# Patient Record
Sex: Female | Born: 1985 | Race: White | Hispanic: No | Marital: Married | Smoking: Former smoker
Health system: Southern US, Community
[De-identification: ages and names within clinical notes are randomized; demographics above are authoritative.]

## PROBLEM LIST (undated history)

## (undated) ENCOUNTER — Inpatient Hospital Stay: Payer: Self-pay

## (undated) DIAGNOSIS — J40 Bronchitis, not specified as acute or chronic: Secondary | ICD-10-CM

## (undated) DIAGNOSIS — O24419 Gestational diabetes mellitus in pregnancy, unspecified control: Secondary | ICD-10-CM

## (undated) DIAGNOSIS — R748 Abnormal levels of other serum enzymes: Secondary | ICD-10-CM

## (undated) DIAGNOSIS — M797 Fibromyalgia: Secondary | ICD-10-CM

## (undated) DIAGNOSIS — K589 Irritable bowel syndrome without diarrhea: Secondary | ICD-10-CM

## (undated) DIAGNOSIS — F329 Major depressive disorder, single episode, unspecified: Secondary | ICD-10-CM

## (undated) DIAGNOSIS — E282 Polycystic ovarian syndrome: Secondary | ICD-10-CM

## (undated) DIAGNOSIS — N809 Endometriosis, unspecified: Secondary | ICD-10-CM

## (undated) DIAGNOSIS — F4310 Post-traumatic stress disorder, unspecified: Secondary | ICD-10-CM

## (undated) DIAGNOSIS — R011 Cardiac murmur, unspecified: Secondary | ICD-10-CM

## (undated) DIAGNOSIS — F32A Depression, unspecified: Secondary | ICD-10-CM

## (undated) HISTORY — PX: HERNIA REPAIR: SHX51

## (undated) HISTORY — DX: Major depressive disorder, single episode, unspecified: F32.9

## (undated) HISTORY — DX: Irritable bowel syndrome without diarrhea: K58.9

## (undated) HISTORY — PX: WISDOM TOOTH EXTRACTION: SHX21

## (undated) HISTORY — PX: DILATION AND CURETTAGE OF UTERUS: SHX78

## (undated) HISTORY — PX: TONSILLECTOMY: SUR1361

## (undated) HISTORY — DX: Depression, unspecified: F32.A

## (undated) HISTORY — DX: Gestational diabetes mellitus in pregnancy, unspecified control: O24.419

---

## 2016-02-18 ENCOUNTER — Emergency Department: Payer: BLUE CROSS/BLUE SHIELD

## 2016-02-18 ENCOUNTER — Emergency Department
Admission: EM | Admit: 2016-02-18 | Discharge: 2016-02-18 | Disposition: A | Discharge status: Home / Self Care | Payer: BLUE CROSS/BLUE SHIELD | Attending: Emergency Medicine | Admitting: Emergency Medicine

## 2016-02-18 ENCOUNTER — Encounter: Payer: Self-pay | Admitting: Emergency Medicine

## 2016-02-18 DIAGNOSIS — R05 Cough: Secondary | ICD-10-CM | POA: Diagnosis present

## 2016-02-18 DIAGNOSIS — J40 Bronchitis, not specified as acute or chronic: Secondary | ICD-10-CM | POA: Insufficient documentation

## 2016-02-18 DIAGNOSIS — Z79899 Other long term (current) drug therapy: Secondary | ICD-10-CM | POA: Insufficient documentation

## 2016-02-18 HISTORY — DX: Fibromyalgia: M79.7

## 2016-02-18 HISTORY — DX: Endometriosis, unspecified: N80.9

## 2016-02-18 HISTORY — DX: Post-traumatic stress disorder, unspecified: F43.10

## 2016-02-18 HISTORY — DX: Polycystic ovarian syndrome: E28.2

## 2016-02-18 MED ORDER — BENZONATATE 100 MG PO CAPS: ORAL_CAPSULE | ORAL | 0 refills | Status: DC

## 2016-02-18 MED ORDER — AZITHROMYCIN 250 MG PO TABS: ORAL_TABLET | ORAL | 0 refills | Status: AC

## 2016-02-18 MED ORDER — ACETAMINOPHEN-CODEINE #3 300-30 MG PO TABS: 1.0000 | ORAL_TABLET | Freq: Three times a day (TID) | ORAL | 0 refills | Status: DC | PRN

## 2016-02-18 MED ORDER — IPRATROPIUM-ALBUTEROL 0.5-2.5 (3) MG/3ML IN SOLN
3.0000 mL | Freq: Once | RESPIRATORY_TRACT | Status: AC
Start: 2016-02-18 — End: 2016-02-18
  Administered 2016-02-18: 3 mL via RESPIRATORY_TRACT
  Filled 2016-02-18: qty 3

## 2016-02-18 MED ORDER — ALBUTEROL SULFATE HFA 108 (90 BASE) MCG/ACT IN AERS: 2.0000 | INHALATION_SPRAY | Freq: Four times a day (QID) | RESPIRATORY_TRACT | 0 refills | Status: DC | PRN

## 2016-02-18 NOTE — Discharge Instructions (Signed)
Take the prescription meds as directed. Start and OTC cough medicine like Delsym as well as a daily allergy medicine. Increase fluid intake and consider using a room humidifier overnight. [facility] as needed.

## 2016-02-18 NOTE — ED Triage Notes (Signed)
States thought she had the flu Tuesday night felt better, now has cough, bodyaches, headache. States tylenol didn't help

## 2016-02-18 NOTE — ED Provider Notes (Signed)
[facility] Emergency Department Provider Note ____________________________________________  Time seen: 1528  I have reviewed the triage vital signs and the nursing notes.  HISTORY  Chief Complaint  Cough and Generalized Body Aches  HPI Melissa Carr is a 31 y.o. female to the ED with flulike symptoms since Tuesday. She reports some improvement in her symptoms, overall, but continues to experience cough, runny nose. She denies fevers over 100F and reports resolved diarrhea. She has been dosing cough drops and Tylenol for symptoms. She did not receive the flu vaccine for the season.   Past Medical History:  Diagnosis Date  . Endometriosis   . Fibromyalgia   . PCOS (polycystic ovarian syndrome)   . PTSD (post-traumatic stress disorder)     There are no active problems to display for this patient.   Past Surgical History:  Procedure Laterality Date  . CESAREAN SECTION    . HERNIA REPAIR      Prior to Admission medications   Medication Sig Start Date End Date Taking? Authorizing Provider  cholecalciferol (VITAMIN D) 1000 units tablet Take 1,000 Units by mouth daily.   Yes Historical Provider, MD  cyanocobalamin 1000 MCG tablet Take 1,000 mcg by mouth daily.   Yes Historical Provider, MD  cyclobenzaprine (FLEXERIL) 10 MG tablet Take 10 mg by mouth at bedtime.   Yes Historical Provider, MD  DULoxetine (CYMBALTA) 30 MG capsule Take 30 mg by mouth daily.   Yes Historical Provider, MD  fluticasone (FLONASE) 50 MCG/ACT nasal spray Place into both nostrils daily.   Yes Historical Provider, MD  hydrOXYzine (ATARAX/VISTARIL) 25 MG tablet Take 25 mg by mouth daily.   Yes Historical Provider, MD  meloxicam (MOBIC) 15 MG tablet Take 15 mg by mouth daily.   Yes Historical Provider, MD  metFORMIN (GLUCOPHAGE) 500 MG tablet Take by mouth at bedtime.   Yes Historical Provider, MD  prazosin (MINIPRESS) 1 MG capsule Take 1 mg by mouth at bedtime.   Yes Historical  Provider, MD  pregabalin (LYRICA) 75 MG capsule Take 75 mg by mouth 2 (two) times daily.   Yes Historical Provider, MD  traZODone (DESYREL) 100 MG tablet Take 100 mg by mouth at bedtime.   Yes Historical Provider, MD  acetaminophen-codeine (TYLENOL #3) 300-30 MG tablet Take 1 tablet by mouth every 8 (eight) hours as needed for moderate pain. 02/18/16   Beaulah Romanek V Bacon Analiah Drum, PA-C  albuterol (PROVENTIL HFA;VENTOLIN HFA) 108 (90 Base) MCG/ACT inhaler Inhale 2 puffs into the lungs every 6 (six) hours as needed for wheezing or shortness of breath. 02/18/16   Brinleigh Tew V Bacon Elowen Debruyn, PA-C  azithromycin (ZITHROMAX Z-PAK) 250 MG tablet Take 2 tablets (500 mg) on  Day 1,  followed by 1 tablet (250 mg) once daily on Days 2 through 5. 02/18/16 02/23/16  Stefan Markarian V Bacon Caylan Schifano, PA-C  benzonatate (TESSALON PERLES) 100 MG capsule Take 1-2 tabs TID prn cough 02/18/16   Icie Kuznicki V Bacon Jejuan Scala, PA-C    Allergies Patient has no known allergies.  History reviewed. No pertinent family history.  Social History Social History  Substance Use Topics  . Smoking status: Never Smoker  . Smokeless tobacco: Never Used  . Alcohol use Yes     Comment: socially    Review of Systems  Constitutional: Negative for fever. Eyes: Negative for visual changes. ENT: Negative for sore throat. Cardiovascular: Negative for chest pain. Respiratory: Negative for shortness of breath. Reports cough as above.  Gastrointestinal: Negative for abdominal pain, vomiting and  diarrhea. Neurological: Negative for headaches, focal weakness or numbness. ____________________________________________  PHYSICAL EXAM:  VITAL SIGNS: ED Triage Vitals  Enc Vitals Group     BP 02/18/16 1438 (!) 109/41     Pulse Rate 02/18/16 1438 97     Resp 02/18/16 1438 18     Temp 02/18/16 1438 98.4 F (36.9 C)     Temp Source 02/18/16 1438 Oral     SpO2 02/18/16 1438 98 %     Weight 02/18/16 1439 240 lb (108.9 kg)     Height 02/18/16 1439 5\' 4"  (1.626  m)     Head Circumference --      Peak Flow --      Pain Score 02/18/16 1439 6     Pain Loc --      Pain Edu? --      Excl. in GC? --     Constitutional: Alert and oriented. Well appearing and in no distress. Head: Normocephalic and atraumatic. Eyes: Conjunctivae are normal. PERRL. Normal extraocular movements Neck: Supple. No thyromegaly. Hematological/Lymphatic/Immunological: No cervical lymphadenopathy. Cardiovascular: Normal rate, regular rhythm. Normal distal pulses. Respiratory: Normal respiratory effort. No wheezes/rales. Audible rhonchi bilaterally. Musculoskeletal: Nontender with normal range of motion in all extremities.  Neurologic:  Normal gait without ataxia. Normal speech and language. No gross focal neurologic deficits are appreciated. Skin:  Skin is warm, dry and intact. No rash noted. ____________________________________________   RADIOLOGY CXR  IMPRESSION: Bronchitis pattern.  No consolidation or collapse. ____________________________________________  PROCEDURES  Duoneb x 1 ____________________________________________  INITIAL IMPRESSION / ASSESSMENT AND PLAN / ED COURSE  Patient with a radiologically confirmed bronchitis on presentation. She likely has a viral URI and has developed bronchitis in the midst. She will be discharged with a prescription for albuterol inhaler, azithromycin, Tessalon Perles, and Tylenol 3 for pain and cough relief. She is first follow with primary care provider at the [facility] for ongoing symptom management. She is also advised to use over-the-counter cough medicine, allergy medicine, and decongestants as needed. ____________________________________________  FINAL CLINICAL IMPRESSION(S) / ED DIAGNOSES  Final diagnoses:  Bronchitis     Lissa Hoard, PA-C 02/18/16 1721    Arnaldo Natal, MD 02/19/16 [phone]

## 2016-02-18 NOTE — ED Notes (Signed)
NAD noted at time of D/C. Pt denies questions or concerns. Pt ambulatory to the lobby at this time.  

## 2016-02-18 NOTE — ED Notes (Signed)
Pt c/o generalized body aches, pain in both her ears, cough, loss of her voice. Pt states she has soreness in her chest due to coughing. Pt is alert and oriented at this time, drinking a gatorade and watching TV. Will continue to monitor for further patient needs.

## 2016-03-27 ENCOUNTER — Encounter: Payer: Self-pay | Admitting: Emergency Medicine

## 2016-03-27 ENCOUNTER — Emergency Department: Payer: Non-veteran care

## 2016-03-27 ENCOUNTER — Emergency Department
Admission: EM | Admit: 2016-03-27 | Discharge: 2016-03-27 | Disposition: A | Discharge status: Home / Self Care | Payer: Non-veteran care | Attending: Emergency Medicine | Admitting: Emergency Medicine

## 2016-03-27 DIAGNOSIS — Z79899 Other long term (current) drug therapy: Secondary | ICD-10-CM | POA: Insufficient documentation

## 2016-03-27 DIAGNOSIS — O208 Other hemorrhage in early pregnancy: Secondary | ICD-10-CM | POA: Diagnosis not present

## 2016-03-27 DIAGNOSIS — O26891 Other specified pregnancy related conditions, first trimester: Secondary | ICD-10-CM | POA: Diagnosis present

## 2016-03-27 DIAGNOSIS — N9489 Other specified conditions associated with female genital organs and menstrual cycle: Secondary | ICD-10-CM | POA: Diagnosis not present

## 2016-03-27 DIAGNOSIS — N83292 Other ovarian cyst, left side: Secondary | ICD-10-CM

## 2016-03-27 DIAGNOSIS — O418X10 Other specified disorders of amniotic fluid and membranes, first trimester, not applicable or unspecified: Secondary | ICD-10-CM

## 2016-03-27 DIAGNOSIS — Z7984 Long term (current) use of oral hypoglycemic drugs: Secondary | ICD-10-CM | POA: Insufficient documentation

## 2016-03-27 DIAGNOSIS — O3481 Maternal care for other abnormalities of pelvic organs, first trimester: Secondary | ICD-10-CM | POA: Diagnosis not present

## 2016-03-27 DIAGNOSIS — Z3A08 8 weeks gestation of pregnancy: Secondary | ICD-10-CM | POA: Insufficient documentation

## 2016-03-27 DIAGNOSIS — O2341 Unspecified infection of urinary tract in pregnancy, first trimester: Secondary | ICD-10-CM | POA: Diagnosis not present

## 2016-03-27 DIAGNOSIS — O468X1 Other antepartum hemorrhage, first trimester: Secondary | ICD-10-CM

## 2016-03-27 DIAGNOSIS — R109 Unspecified abdominal pain: Secondary | ICD-10-CM

## 2016-03-27 LAB — COMPREHENSIVE METABOLIC PANEL
ALT: 18 U/L (ref 14–54)
AST: 15 U/L (ref 15–41)
Albumin: 3.5 g/dL (ref 3.5–5.0)
Alkaline Phosphatase: 59 U/L (ref 38–126)
Anion gap: 5 (ref 5–15)
BILIRUBIN TOTAL: 0.4 mg/dL (ref 0.3–1.2)
BUN: 9 mg/dL (ref 6–20)
CALCIUM: 8.8 mg/dL — AB (ref 8.9–10.3)
CHLORIDE: 106 mmol/L (ref 101–111)
CO2: 26 mmol/L (ref 22–32)
CREATININE: 0.58 mg/dL (ref 0.44–1.00)
GFR calc non Af Amer: >60 mL/min (ref >60)
Glucose, Bld: 81 mg/dL (ref 65–99)
Potassium: 3.9 mmol/L (ref 3.5–5.1)
Sodium: 137 mmol/L (ref 135–145)
TOTAL PROTEIN: 6.8 g/dL (ref 6.5–8.1)

## 2016-03-27 LAB — CBC WITH DIFFERENTIAL/PLATELET
BASOS ABS: 0 10*3/uL (ref 0–0.1)
BASOS PCT: 0 %
EOS ABS: 0 10*3/uL (ref 0–0.7)
EOS PCT: 0 %
HCT: 35.4 % (ref 35.0–47.0)
HEMOGLOBIN: 12.2 g/dL (ref 12.0–16.0)
Lymphocytes Relative: 21 %
Lymphs Abs: 2.3 10*3/uL (ref 1.0–3.6)
MCH: 29 pg (ref 26.0–34.0)
MCHC: 34.5 g/dL (ref 32.0–36.0)
MCV: 84 fL (ref 80.0–100.0)
Monocytes Absolute: 0.7 10*3/uL (ref 0.2–0.9)
Monocytes Relative: 6 %
NEUTROS PCT: 73 %
Neutro Abs: 8.1 10*3/uL — ABNORMAL HIGH (ref 1.4–6.5)
PLATELETS: 250 10*3/uL (ref 150–440)
RBC: 4.21 MIL/uL (ref 3.80–5.20)
RDW: 13.3 % (ref 11.5–14.5)
WBC: 11.2 10*3/uL — AB (ref 3.6–11.0)

## 2016-03-27 LAB — CHLAMYDIA/NGC RT PCR (ARMC ONLY)
Chlamydia Tr: NOT DETECTED
N gonorrhoeae: NOT DETECTED

## 2016-03-27 LAB — URINALYSIS, COMPLETE (UACMP) WITH MICROSCOPIC
BILIRUBIN URINE: NEGATIVE
GLUCOSE, UA: NEGATIVE mg/dL
HGB URINE DIPSTICK: NEGATIVE
Ketones, ur: NEGATIVE mg/dL
Leukocytes, UA: NEGATIVE
NITRITE: NEGATIVE
PROTEIN: NEGATIVE mg/dL
Specific Gravity, Urine: 1.006 (ref 1.005–1.030)
pH: 7 (ref 5.0–8.0)

## 2016-03-27 LAB — HCG, QUANTITATIVE, PREGNANCY: HCG, BETA CHAIN, QUANT, S: 110313 m[IU]/mL — AB (ref <5)

## 2016-03-27 LAB — WET PREP, GENITAL
Clue Cells Wet Prep HPF POC: NONE SEEN
SPERM: NONE SEEN
TRICH WET PREP: NONE SEEN
YEAST WET PREP: NONE SEEN

## 2016-03-27 MED ORDER — CEPHALEXIN 500 MG PO CAPS: 500.0000 mg | ORAL_CAPSULE | Freq: Two times a day (BID) | ORAL | 0 refills | Status: DC

## 2016-03-27 MED ORDER — ACETAMINOPHEN 500 MG PO TABS
ORAL_TABLET | ORAL | Status: AC
  Administered 2016-03-27: 1000 mg via ORAL
  Filled 2016-03-27: qty 2

## 2016-03-27 MED ORDER — CEPHALEXIN 500 MG PO CAPS
500.0000 mg | ORAL_CAPSULE | Freq: Once | ORAL | Status: AC
  Administered 2016-03-27: 500 mg via ORAL
  Filled 2016-03-27: qty 1

## 2016-03-27 MED ORDER — ACETAMINOPHEN 500 MG PO TABS
1000.0000 mg | ORAL_TABLET | Freq: Once | ORAL | Status: AC
  Administered 2016-03-27: 1000 mg via ORAL

## 2016-03-27 NOTE — ED Triage Notes (Signed)
She arrives today to triage with reports of abdominal cramping for the last two weeks  - she is [redacted] weeks pregnant and has a history of  Fibromyalgia ovarian cysts and endometriosis  Pt with concerns of being pregnant in her tubes due to cramping is different from her normal pain

## 2016-03-27 NOTE — Discharge Instructions (Addendum)
You were evaluated for left lower abdominal pain and early pregnancy, and pregnancy was confirmed by ultrasound today. He also appeared to have a cystic structure in the left lower abdomen which is probably continue the additional pain there. During pregnancy, it states this just to take Tylenol as needed for pain as directed on the label.  As we discussed, your ultrasound did show a small amount of blood between the placenta and fetus called subchorionic hemorrhage. Often times this subsides and pregnancy continues normally. Occasionally this can be an early start to a miscarriage.  You are being treated for the possible urinary tract infection with Keflex antibiotic. A culture has been sent, and you will receive a phone call if you need a change in antibiotic.  Return to the emergency department immediately for any new or worsening pain, vaginal bleeding, fever, or any other symptoms concerning to you.

## 2016-03-27 NOTE — ED Provider Notes (Addendum)
[facility] Emergency Department Provider Note ____________________________________________   I have reviewed the triage vital signs and the triage nursing note.  HISTORY  Chief Complaint Abdominal Cramping   Historian Patient, and her mother is in the room as well  HPI Melissa Carr is a 31 y.o. female G2 P1 without prior pregnancy complications, presents stating she believes she is about [redacted] weeks pregnant by last menstrual period, and has denied been evaluated or had an ultrasound this pregnancy yet, presents today due to ongoing and worsening lower abdominal cramping especially in the left lower quadrant. No vaginal bleeding or spotting. She has had some nausea without vomiting. She's had some mild dysuria and frequency without hematuria. Denies vaginal discharge.  She has been off of her fibromyalgia medications since she found out she was pregnant, and states that she knows the difference between cyst pain and endometriosis pain and fibromyalgia pain, and this left lower quadrant pain is different.  Pain is moderate. She hasn't taken anything today, and is open to trying some Tylenol right now.    Past Medical History:  Diagnosis Date  . Endometriosis   . Fibromyalgia   . PCOS (polycystic ovarian syndrome)   . PTSD (post-traumatic stress disorder)     There are no active problems to display for this patient.   Past Surgical History:  Procedure Laterality Date  . CESAREAN SECTION    . HERNIA REPAIR      Prior to Admission medications   Medication Sig Start Date End Date Taking? Authorizing Provider  acetaminophen-codeine (TYLENOL #3) 300-30 MG tablet Take 1 tablet by mouth every 8 (eight) hours as needed for moderate pain. 02/18/16   Jenise V Bacon Menshew, PA-C  albuterol (PROVENTIL HFA;VENTOLIN HFA) 108 (90 Base) MCG/ACT inhaler Inhale 2 puffs into the lungs every 6 (six) hours as needed for wheezing or shortness of breath. 02/18/16   Jenise V  Bacon Menshew, PA-C  benzonatate (TESSALON PERLES) 100 MG capsule Take 1-2 tabs TID prn cough 02/18/16   Jenise V Bacon Menshew, PA-C  cholecalciferol (VITAMIN D) 1000 units tablet Take 1,000 Units by mouth daily.    Historical Provider, MD  cyanocobalamin 1000 MCG tablet Take 1,000 mcg by mouth daily.    Historical Provider, MD  cyclobenzaprine (FLEXERIL) 10 MG tablet Take 10 mg by mouth at bedtime.    Historical Provider, MD  DULoxetine (CYMBALTA) 30 MG capsule Take 30 mg by mouth daily.    Historical Provider, MD  fluticasone (FLONASE) 50 MCG/ACT nasal spray Place into both nostrils daily.    Historical Provider, MD  hydrOXYzine (ATARAX/VISTARIL) 25 MG tablet Take 25 mg by mouth daily.    Historical Provider, MD  meloxicam (MOBIC) 15 MG tablet Take 15 mg by mouth daily.    Historical Provider, MD  metFORMIN (GLUCOPHAGE) 500 MG tablet Take by mouth at bedtime.    Historical Provider, MD  prazosin (MINIPRESS) 1 MG capsule Take 1 mg by mouth at bedtime.    Historical Provider, MD  pregabalin (LYRICA) 75 MG capsule Take 75 mg by mouth 2 (two) times daily.    Historical Provider, MD  traZODone (DESYREL) 100 MG tablet Take 100 mg by mouth at bedtime.    Historical Provider, MD    No Known Allergies  No family history on file.  Social History Social History  Substance Use Topics  . Smoking status: Never Smoker  . Smokeless tobacco: Never Used  . Alcohol use Yes     Comment: socially  Review of Systems  Constitutional: Negative for fever. Eyes: Negative for visual changes. ENT: Negative for sore throat. Cardiovascular: Negative for chest pain. Respiratory: Negative for shortness of breath. Gastrointestinal: Negative for vomiting and diarrhea. Genitourinary: Negative for dysuria. Musculoskeletal: Negative for back pain. Skin: Negative for rash. Neurological: Negative for headache. 10 point Review of Systems otherwise  negative ____________________________________________   PHYSICAL EXAM:  VITAL SIGNS: ED Triage Vitals  Enc Vitals Group     BP 03/27/16 1035 128/81     Pulse Rate 03/27/16 1035 95     Resp 03/27/16 1035 18     Temp 03/27/16 1035 99.3 F (37.4 C)     Temp Source 03/27/16 1035 Oral     SpO2 03/27/16 1035 100 %     Weight 03/27/16 1035 241 lb (109.3 kg)     Height 03/27/16 1035 5\' 4"  (1.626 m)     Head Circumference --      Peak Flow --      Pain Score 03/27/16 1044 6     Pain Loc --      Pain Edu? --      Excl. in GC? --      Constitutional: Alert and oriented. Well appearing and in no distress. HEENT   Head: Normocephalic and atraumatic.      Eyes: Conjunctivae are normal. PERRL. Normal extraocular movements.      Ears:         Nose: No congestion/rhinnorhea.   Mouth/Throat: Mucous membranes are moist.   Neck: No stridor. Cardiovascular/Chest: Normal rate, regular rhythm.  No murmurs, rubs, or gallops. Respiratory: Normal respiratory effort without tachypnea nor retractions. Breath sounds are clear and equal bilaterally. No wheezes/rales/rhonchi. Gastrointestinal: Soft. No distention, no guarding, no rebound. Obese. Mild tenderness right side right lower abdomen and moderate tenderness left lower quadrant. No focal McBurney's point tenderness. Genitourinary/rectal:  Tiny amount of vaginal discharge. No vaginal bleeding. Mild cervical/left adnexal tenderness. Musculoskeletal: Nontender with normal range of motion in all extremities. No joint effusions.  No lower extremity tenderness.  No edema. Neurologic:  Normal speech and language. No gross or focal neurologic deficits are appreciated. Skin:  Skin is warm, dry and intact. No rash noted. Psychiatric: Mood and affect are normal. Speech and behavior are normal. Patient exhibits appropriate insight and judgment.   ____________________________________________  LABS (pertinent positives/negatives)  Labs Reviewed   WET PREP, GENITAL - Abnormal; Notable for the following:       Result Value   WBC, Wet Prep HPF POC MODERATE (*)    All other components within normal limits  HCG, QUANTITATIVE, PREGNANCY - Abnormal; Notable for the following:    hCG, Beta Chain, Quant, S 110,313 (*)    All other components within normal limits  COMPREHENSIVE METABOLIC PANEL - Abnormal; Notable for the following:    Calcium 8.8 (*)    All other components within normal limits  CBC WITH DIFFERENTIAL/PLATELET - Abnormal; Notable for the following:    WBC 11.2 (*)    Neutro Abs 8.1 (*)    All other components within normal limits  URINALYSIS, COMPLETE (UACMP) WITH MICROSCOPIC - Abnormal; Notable for the following:    Color, Urine STRAW (*)    APPearance CLEAR (*)    Bacteria, UA RARE (*)    Squamous Epithelial / LPF 0-5 (*)    All other components within normal limits  CHLAMYDIA/NGC RT PCR ([facility] ONLY)    ____________________________________________    EKG I, Governor Rooks, MD, the attending physician  have personally viewed and interpreted all ECGs.  None ____________________________________________  RADIOLOGY All Xrays were viewed by me. Imaging interpreted by Radiologist.  Pelvic and transvaginal ultrasound:  IMPRESSION: 1. Single live intrauterine pregnancy as described above. 2. Small subchorionic hemorrhage. 3. 1.6 x 1.9 x 1.7 cm hyperechoic left ovarian mass with peripheral Doppler flow which may reflect an atypical corpus luteum cyst versus hemorrhagic cyst versus endometrioma. __________________________________________  PROCEDURES  Procedure(s) performed: None  Critical Care performed: None  ____________________________________________   ED COURSE / ASSESSMENT AND PLAN  Pertinent labs & imaging results that were available during my care of the patient were reviewed by me and considered in my medical decision making (see chart for details).   Melissa Carr is presenting with acute  on chronic pelvic pain, the acute pain is located in the left lower quadrant. She is going pregnancy based on home pregnancy test.   Ultrasound confirms IUP. She does have left adnexal cyst versus endometrioma. Discussed this with the patient that that's probably was giving her the left-sided pain.  We also discussed subchorionic hemorrhage.  Additional evaluation, reassuring, I am not suspicious at this point of any other intra-abdominal emergency such as appendicitis, etc.  Clinically not concerned for pelvic inflammatory disease. Her urinalysis shows bacteria, given the fact that she is pregnant and her white blood cell count is slightly elevated with left shift, I am going to go ahead and treat her early for the possible urinary tract infection with Keflex. A urine culture has been sent.  CONSULTATIONS:   None   Patient / Family / Caregiver informed of clinical course, medical decision-making process, and agree with plan.   I discussed return precautions, follow-up instructions, and discharge instructions with patient and/or family.   ___________________________________________   FINAL CLINICAL IMPRESSION(S) / ED DIAGNOSES   Final diagnoses:  Abdominal pain during pregnancy in first trimester  Other ovarian cyst, left side  Subchorionic hemorrhage of placenta in first trimester, single or unspecified fetus              Note: This dictation was prepared with Dragon dictation. Any transcriptional errors that result from this process are unintentional    Governor Rooksebecca Yanky Vanderburg, MD 03/27/16 1425    Governor Rooksebecca Latanya Hemmer, MD 03/27/16 [phone]

## 2016-03-29 LAB — URINE CULTURE

## 2016-09-06 ENCOUNTER — Inpatient Hospital Stay
Admission: EM | Admit: 2016-09-06 | Discharge: 2016-09-06 | Disposition: A | Discharge status: Home / Self Care | Payer: BLUE CROSS/BLUE SHIELD | Attending: Obstetrics and Gynecology | Admitting: Obstetrics and Gynecology

## 2016-09-06 DIAGNOSIS — Z6841 Body Mass Index (BMI) 40.0 and over, adult: Secondary | ICD-10-CM | POA: Insufficient documentation

## 2016-09-06 DIAGNOSIS — Z3A31 31 weeks gestation of pregnancy: Secondary | ICD-10-CM | POA: Diagnosis not present

## 2016-09-06 DIAGNOSIS — E669 Obesity, unspecified: Secondary | ICD-10-CM | POA: Insufficient documentation

## 2016-09-06 DIAGNOSIS — H538 Other visual disturbances: Secondary | ICD-10-CM | POA: Diagnosis present

## 2016-09-06 DIAGNOSIS — O99213 Obesity complicating pregnancy, third trimester: Secondary | ICD-10-CM | POA: Insufficient documentation

## 2016-09-06 DIAGNOSIS — O24419 Gestational diabetes mellitus in pregnancy, unspecified control: Secondary | ICD-10-CM | POA: Insufficient documentation

## 2016-09-06 LAB — GLUCOSE, CAPILLARY: Glucose-Capillary: 123 mg/dL — ABNORMAL HIGH (ref 65–99)

## 2016-09-06 NOTE — OB Triage Note (Signed)
Pt presents c/o blood sugar making her feel funny(nausea/dizziness) today around 0845 this morning. Fasting blood sugar at 0648 this am was 130. Pt checked blood sugar and states it was 190 at 0845. Sugar was 90 at 1145 this morning. Pt denies any LOF or VB. Pt states +FM

## 2016-09-06 NOTE — Progress Notes (Signed)
Melissa MoralesMelissa M Carr is a 31 y.o. G2P1001 at 31w46 by LMP of 01/29/16 with EDD of 11/04/16, EDD was confirmed with US at 8 4/7 weeks with EDD of 11/02/16 which c/w EDD of 11/04/16. [facility] A2G[facility] with [facility] class pending and the need to place on insulin.   Subjective: "I felt faint at work and my eyesight was blurred", I feel much better now. Took sugar and it was 190 so came in.   Objective: BP 114/60   Pulse (!) 104   Temp 98.1 F (36.7 C) (Oral)   Resp 18   Ht 5\' 4"  (1.626 m)   Wt 117.9 kg (260 lb)   LMP 01/29/2016   BMI 44.63 kg/m  No intake/output data recorded. No intake/output data recorded.  FHT: 145, reactive with 2 accels 15 x 15 BPM UC:  Rare UC noted.  SVE: Not evaluated due to no labor process    Gen:31 yo white female in NAD.  HEENT: Eyes non-icteric, Speech is clear. Heart: S1S2, RRR, No M/R/G Lungs: CTA bilat, no W/R/R ZOX:[id number]:Gravid Extrems: 1+ edema, DTR's 1+/0 Labs: Lab Results  Component Value Date   WBC 11.2 (H) 03/27/2016   HGB 12.2 03/27/2016   HCT 35.4 03/27/2016   MCV 84.0 03/27/2016   PLT 250 03/27/2016  FS glucose: 90 after arrival   Assessment / Plan: A:1. A2GDM awaiting nutrition class and Insulin class 2. Obesity 3. IUP at 31 6/7 weeks P: Pt has appt next week with [facility] on 09/10/16 and awaiting [facility] appt. [facility] and [facility] are complicating her entry into getting the class and the insulin. Pt awaiting to hear from them on Monday. 2 Strict [facility]'s. 3. Come back to [facility] if worsening. Melissa Pimplearon W. Melissa Coke, RN, MSN, CNM, FNP   Melissa Carr 09/06/2016, 6:06 PM

## 2016-09-09 ENCOUNTER — Encounter
Admission: RE | Admit: 2016-09-09 | Discharge: 2016-09-09 | Disposition: A | Discharge status: Home / Self Care | Payer: Non-veteran care | Source: Ambulatory Visit | Attending: Anesthesiology | Admitting: Anesthesiology

## 2016-09-09 ENCOUNTER — Encounter: Payer: Self-pay | Admitting: *Deleted

## 2016-09-09 ENCOUNTER — Encounter: Payer: Non-veteran care | Attending: Obstetrics and Gynecology | Admitting: *Deleted

## 2016-09-09 VITALS — BP 110/76 | Ht 64.0 in | Wt 259.8 lb

## 2016-09-09 DIAGNOSIS — Z3A00 Weeks of gestation of pregnancy not specified: Secondary | ICD-10-CM | POA: Diagnosis not present

## 2016-09-09 DIAGNOSIS — O24414 Gestational diabetes mellitus in pregnancy, insulin controlled: Secondary | ICD-10-CM

## 2016-09-09 DIAGNOSIS — O24419 Gestational diabetes mellitus in pregnancy, unspecified control: Secondary | ICD-10-CM | POA: Insufficient documentation

## 2016-09-09 DIAGNOSIS — Z713 Dietary counseling and surveillance: Secondary | ICD-10-CM | POA: Diagnosis not present

## 2016-09-09 NOTE — Progress Notes (Signed)
Diabetes Self-Management Education  Visit Type: First/Initial  Appt. Start Time: 1040 Appt. End Time: 0347 09/09/2016  Ms. Melissa Carr, identified by name and date of birth, is a 31 y.o. female with a diagnosis of Diabetes: Gestational Diabetes.   ASSESSMENT  Blood pressure 110/76, height _0  (1.626 m), weight 259 lb 12.8 oz (117.8 kg), last menstrual period 01/29/2016. Body mass index is 44.59 kg/m.      Diabetes Self-Management Education - 09/09/16 1457      Visit Information   Visit Type First/Initial     Initial Visit   Diabetes Type Gestational Diabetes   Are you currently following a meal plan? Yes   What type of meal plan do you follow? "staying away from fried foods, sweets and eating more often"   Are you taking your medications as prescribed? Yes   Date Diagnosed past month     Health Coping   How would you rate your overall health? Fair     Psychosocial Assessment   Patient Belief/Attitude about Diabetes Motivated to manage diabetes  "worried, angry at myself"   Self-care barriers None   Self-management support Doctor's office;Family   Patient Concerns Nutrition/Meal planning;Monitoring;Other (comment)  "don't want this after my pregnancy"   Special Needs None   Preferred Learning Style Auditory;Visual;Hands on   [place] in progress   How often do you need to have someone help you when you read instructions, pamphlets, or other written materials from your doctor or pharmacy? 1 - Never   What is the last grade level you completed in school? college     Pre-Education Assessment   Patient understands the diabetes disease and treatment process. Needs Instruction   Patient understands incorporating nutritional management into lifestyle. Needs Instruction   Patient undertands incorporating physical activity into lifestyle. Needs Instruction   Patient understands using medications safely. Needs Instruction   Patient understands monitoring  blood glucose, interpreting and using results Needs Instruction   Patient understands prevention, detection, and treatment of acute complications. Needs Instruction   Patient understands prevention, detection, and treatment of chronic complications. Needs Instruction   Patient understands how to develop strategies to address psychosocial issues. Needs Instruction   Patient understands how to develop strategies to promote health/change behavior. Needs Instruction     Complications   How often do you check your blood sugar? 1-2 times/day  Pt has a meter and checks blood sugar 1-2 x day. She reports feeling "weird" at end of appointment. Blood sugar was 128 mg/dL at 12:15 pm - 1 hr after eating pack of peanut butter crackers. She reported she needed to get food before her next appt at 1:30   Fasting Blood glucose range (mg/dL) 70-129;130-179  FBG ranged from 111-152 mg/dL.    Postprandial Blood glucose range (mg/dL) 70-129  Post meal readings ranged from 99-102 mg/dL.    Have you had a dilated eye exam in the past 12 months? No   Have you had a dental exam in the past 12 months? No   Are you checking your feet? No     Dietary Intake   Breakfast 2 boiled eggs; was eating cereal and milk   Snack (morning) fruit   Lunch left overs from supper   Snack (afternoon) fruit or yogurt drink or peanut butter crustable   Dinner fish, chicken, beef, little pork with apples and sweet potatoes, green beans, baked beans broccoli, occasional corn and peas   Snack (evening) banana, peanut bar   Beverage(s)  water, 1 cup of coffee, Boost     Exercise   Exercise Type Light (walking / raking leaves)   How many days per week to you exercise? 5   How many minutes per day do you exercise? 30   Total minutes per week of exercise 150     Patient Education   Previous Diabetes Education No   Disease state  Definition of diabetes, type 1 and 2, and the diagnosis of diabetes   Nutrition management  Role of diet in  the treatment of diabetes and the relationship between the three main macronutrients and blood glucose level;Reviewed blood glucose goals for pre and post meals and how to evaluate the patients' food intake on their blood glucose level.;Meal timing in regards to the patients' current diabetes medication.   Physical activity and exercise  Role of exercise on diabetes management, blood pressure control and cardiac health.   Medications Taught/reviewed insulin injection, site rotation, insulin storage and needle disposal.;Reviewed patients medication for diabetes, action, purpose, timing of dose and side effects.  Pt injected 5 units NS subcutaneously to left abdomen with some coaching.    Monitoring Purpose and frequency of SMBG.;Taught/discussed recording of test results and interpretation of SMBG.;Ketone testing, when, how.   Acute complications Taught treatment of hypoglycemia - the 15 rule.   Chronic complications Relationship between chronic complications and blood glucose control   Psychosocial adjustment Identified and addressed patients feelings and concerns about diabetes   Preconception care Pregnancy and GDM  Role of pre-pregnancy blood glucose control on the development of the fetus;Reviewed with patient blood glucose goals with pregnancy;Role of family planning for patients with diabetes     Individualized Goals (developed by patient)   Reducing Risk Prevent diabetes complications "Don't want this after my pregnancy"     Outcomes   Expected Outcomes Demonstrated interest in learning. Expect positive outcomes      Individualized Plan for Diabetes Self-Management Training:   Learning Objective:  Patient will have a greater understanding of diabetes self-management. Patient education plan is to attend individual and/or group sessions per assessed needs and concerns.   Plan:   Patient Instructions  Read booklet on Gestational Diabetes Follow Gestational Meal Planning  Guidelines Complete a 3 Day Food Record and bring to next appointment Check blood sugars 4 x day - before breakfast and 2 hrs after every meal and record  Bring blood sugar log to all appointments Purchase urine ketone strips if blood sugars not controlled and check urine ketones every am:  If + increase bedtime snack to 1 protein and 2 carbohydrate servings Walk 20-30 minutes at least 5 x week if permitted by MD Carry fast acting glucose and a snack at all times Rotate injection sites Give morning insulin 30 minutes before breakfast and evening insulin 30 minutes before supper  Morning insulin Novolin N (cloudy)  30 units Novolin R (clear)   10 units                                      40 units  Evening insulin Novolin N (cloudy) 10 units Novolin R (clear)  10 units    20 units   Expected Outcomes:  Demonstrated interest in learning. Expect positive outcomes  Education material provided:  Gestational Booklet Gestational Meal Planning Guidelines Simple Meal Plan 3 Day Food Record Goals for a Healthy Pregnancy Getting Started - Insulin Start Kit (BD)  Symptoms, causes and treatments of Hypoglycemia  If problems or questions, patient to contact team via:  Johny Drilling, [place], [place], CDE [phone]  Future DSME appointment:  September 13, 2016 with dietitian

## 2016-09-09 NOTE — Patient Instructions (Addendum)
Read booklet on Gestational Diabetes Follow Gestational Meal Planning Guidelines Complete a 3 Day Food Record and bring to next appointment Check blood sugars 4 x day - before breakfast and 2 hrs after every meal and record  Bring blood sugar log to all appointments Purchase urine ketone strips if blood sugars not controlled and check urine ketones every am:  If + increase bedtime snack to 1 protein and 2 carbohydrate servings Walk 20-30 minutes at least 5 x week if permitted by MD Carry fast acting glucose and a snack at all times Rotate injection sites Give morning insulin 30 minutes before breakfast and evening insulin 30 minutes before supper  Morning insulin Novolin N (cloudy)  30 units Novolin R (clear)   10 units                                      40 units  Evening insulin Novolin N (cloudy) 10 units Novolin R (clear)  10 units    20 units

## 2016-09-09 NOTE — Consult Note (Signed)
SEEN BY DR P CARROLL. DISCUSSED WITH PATIENT TO TRY TO CONTROL WEIGHT GAIN AND KEEP DIABETES MANAGED REST OF PREGNANCY. [facility]

## 2016-09-11 ENCOUNTER — Telehealth: Payer: Self-pay | Admitting: *Deleted

## 2016-09-11 NOTE — Telephone Encounter (Signed)
Received voice mail from patient. She had questions about blood sugars. Called patient and she reports that she took her blood sugars 3 x in a row with different readings - 20 point difference. Explained that her blood sugar will be different each time even within a few minutes. She saw MD yesterday and insulin has been increased. She is taking N 36 units and R 12 units in the am with N 14 units and R 12 units in the pm. Patient reports no symptoms of hypoglycemia. FBG today was 98 and pp lunch 137. Readings yesterday FBG 138, pp breakfast 169, pp lunch 110 and pp supper 170.  She is still trying to plan her meals. She had eggs for breakfast but no carbohydrates. Her next appt is with the dietitian this Friday.

## 2016-09-13 ENCOUNTER — Encounter: Payer: Non-veteran care | Admitting: Dietician

## 2016-09-13 VITALS — BP 110/68 | Ht 64.0 in | Wt 266.4 lb

## 2016-09-13 DIAGNOSIS — O24414 Gestational diabetes mellitus in pregnancy, insulin controlled: Secondary | ICD-10-CM

## 2016-09-13 DIAGNOSIS — O24419 Gestational diabetes mellitus in pregnancy, unspecified control: Secondary | ICD-10-CM | POA: Diagnosis not present

## 2016-09-13 NOTE — Progress Notes (Signed)
   Patient's BG record indicates BGs have improved since starting insulin on 09/09/16. Fasting BGs are ranging 98-130; post-meal BGs ranging 87-174.   Patient's food diary indicates balanced meals and appropriate carbohydrate and protein intake. She voices uncertainty over what to eat.    Provided 1800kcal meal plan, and wrote individualized menus based on patient's food preferences. Instructed on basic meal planning, healthy vs. unhealthy carbs, importance of protein with meals.  Instructed patient on food safety, including avoidance of Listeriosis, and limiting mercury from fish.  Discussed importance of maintaining healthy lifestyle habits to reduce risk of Type 2 DM as well as Gestational DM with any future pregnancies.  Advised patient to use any remaining testing supplies to test some BGs after delivery, and to have BG tested ideally annually, as well as prior to attempting future pregnancies.

## 2016-09-13 NOTE — Patient Instructions (Signed)
Use meal plan and menus provided to choose and eat a variety of balanced meals and snacks.

## 2016-09-23 ENCOUNTER — Observation Stay
Admission: EM | Admit: 2016-09-23 | Discharge: 2016-09-24 | Disposition: A | Discharge status: Home / Self Care | Payer: Non-veteran care | Attending: Obstetrics & Gynecology | Admitting: Obstetrics & Gynecology

## 2016-09-23 ENCOUNTER — Emergency Department: Payer: Non-veteran care

## 2016-09-23 DIAGNOSIS — O26893 Other specified pregnancy related conditions, third trimester: Secondary | ICD-10-CM | POA: Insufficient documentation

## 2016-09-23 DIAGNOSIS — Z794 Long term (current) use of insulin: Secondary | ICD-10-CM | POA: Insufficient documentation

## 2016-09-23 DIAGNOSIS — Z87891 Personal history of nicotine dependence: Secondary | ICD-10-CM | POA: Insufficient documentation

## 2016-09-23 DIAGNOSIS — O99213 Obesity complicating pregnancy, third trimester: Secondary | ICD-10-CM | POA: Insufficient documentation

## 2016-09-23 DIAGNOSIS — O24913 Unspecified diabetes mellitus in pregnancy, third trimester: Secondary | ICD-10-CM | POA: Insufficient documentation

## 2016-09-23 DIAGNOSIS — O34219 Maternal care for unspecified type scar from previous cesarean delivery: Secondary | ICD-10-CM | POA: Insufficient documentation

## 2016-09-23 DIAGNOSIS — M25551 Pain in right hip: Secondary | ICD-10-CM | POA: Insufficient documentation

## 2016-09-23 DIAGNOSIS — Z7951 Long term (current) use of inhaled steroids: Secondary | ICD-10-CM | POA: Diagnosis not present

## 2016-09-23 DIAGNOSIS — Z7982 Long term (current) use of aspirin: Secondary | ICD-10-CM | POA: Diagnosis not present

## 2016-09-23 DIAGNOSIS — O4703 False labor before 37 completed weeks of gestation, third trimester: Secondary | ICD-10-CM | POA: Diagnosis present

## 2016-09-23 DIAGNOSIS — G8911 Acute pain due to trauma: Secondary | ICD-10-CM

## 2016-09-23 DIAGNOSIS — Z3A36 36 weeks gestation of pregnancy: Secondary | ICD-10-CM | POA: Insufficient documentation

## 2016-09-23 DIAGNOSIS — M797 Fibromyalgia: Secondary | ICD-10-CM | POA: Insufficient documentation

## 2016-09-23 NOTE — ED Notes (Signed)
Pt sees [facility] for OB. Is 34 weeks 3 days. Due date October 6th but C section scheduled October 1st d/t high risk

## 2016-09-23 NOTE — ED Notes (Signed)
Pt slipped tonight. C/o R knee and R hip pain. Pt is alert, oriented, [redacted] weeks pregnant. Strong, regular FHT. Pt feeling baby move around. No vaginal bleeding. C/o dulll belly pain.

## 2016-09-23 NOTE — ED Triage Notes (Signed)
Pt 34wks OB.

## 2016-09-23 NOTE — ED Triage Notes (Signed)
Pt presents via POV s/p fall earlier today. C/o dull abd pain and RLE. Pt able to move RLE without difficulty. Pt denies LOC. States slipped in mud while taking trash out.
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
## 2016-09-23 NOTE — ED Notes (Signed)
-   Denies vaginal bleeding

## 2016-09-24 MED ORDER — ACETAMINOPHEN 325 MG PO TABS
ORAL_TABLET | ORAL | Status: AC
  Administered 2016-09-24: 650 mg via ORAL
  Filled 2016-09-24: qty 2

## 2016-09-24 MED ORDER — ACETAMINOPHEN 325 MG PO TABS
650.0000 mg | ORAL_TABLET | Freq: Four times a day (QID) | ORAL | Status: DC | PRN
  Administered 2016-09-24: 650 mg via ORAL

## 2016-09-24 NOTE — OB Triage Note (Signed)
Pt reports to unit s/p fall from ED. Pt reports she fell onto her hand and right knee around 8 pm on 09/23/16 in the mud taking out her trash. Denies direct injury to abdomen, but that she hyperextended her back in the course of the fall and has felt tightening in her abdomen intermittently since the fall that has improved over the course of time following. Pt report +FM. Denies bleeding, LOC or leaking fluid.

## 2016-09-24 NOTE — Discharge Instructions (Signed)
Fetal Movement Counts °Patient Name: ________________________________________________ Patient Due Date: ____________________ °What is a fetal movement count? °A fetal movement count is the number of times that you feel your baby move during a certain amount of time. This may also be called a fetal kick count. A fetal movement count is recommended for every pregnant woman. You may be asked to start counting fetal movements as early as week 28 of your pregnancy. °Pay attention to when your baby is most active. You may notice your baby's sleep and wake cycles. You may also notice things that make your baby move more. You should do a fetal movement count: °· When your baby is normally most active. °· At the same time each day. ° °A good time to count movements is while you are resting, after having something to eat and drink. °How do I count fetal movements? °1. Find a quiet, comfortable area. Sit, or lie down on your side. °2. Write down the date, the start time and stop time, and the number of movements that you felt between those two times. Take this information with you to your health care visits. °3. For 2 hours, count kicks, flutters, swishes, rolls, and jabs. You should feel at least 10 movements during 2 hours. °4. You may stop counting after you have felt 10 movements. °5. If you do not feel 10 movements in 2 hours, have something to eat and drink. Then, keep resting and counting for 1 hour. If you feel at least 4 movements during that hour, you may stop counting. °Contact a health care provider if: °· You feel fewer than 4 movements in 2 hours. °· Your baby is not moving like he or she usually does. °Date: ____________ Start time: ____________ Stop time: ____________ Movements: ____________ °Date: ____________ Start time: ____________ Stop time: ____________ Movements: ____________ °Date: ____________ Start time: ____________ Stop time: ____________ Movements: ____________ °Date: ____________ Start time:  ____________ Stop time: ____________ Movements: ____________ °Date: ____________ Start time: ____________ Stop time: ____________ Movements: ____________ °Date: ____________ Start time: ____________ Stop time: ____________ Movements: ____________ °Date: ____________ Start time: ____________ Stop time: ____________ Movements: ____________ °Date: ____________ Start time: ____________ Stop time: ____________ Movements: ____________ °Date: ____________ Start time: ____________ Stop time: ____________ Movements: ____________ °This information is not intended to replace advice given to you by your health care provider. Make sure you discuss any questions you have with your health care provider. °Document Released: 02/13/2006 Document Revised: 09/13/2015 Document Reviewed: 02/23/2015 °Elsevier Interactive Patient Education © 2018 [employer] ° °LABOR: When contractions begin, you should start to time them from the beginning of one contraction to the beginning of the next.  When contractions are 5-10 minutes apart or less and have been regular for at least an hour, you should call your health care provider. ° °Notify your doctor if any of the following occur: °1. Bleeding from the vagina 7. Sudden, constant, or occasional abdominal pain  °2. Pain or burning when urinating 8. Sudden gushing of fluid from the vagina (with or without continued leaking)  °3. Chills or fever 9. Fainting spells, "black outs" or loss of consciousness  °4. Increase in vaginal discharge 10. Severe or continued nausea or vomiting  °5. Pelvic pressure (sudden increase) 11. Blurring of vision or spots before the eyes  °6. Baby moving less than usual 12. Leaking of fluid  ° ° °FETAL KICK COUNT: °Lie on your left side for one hour after a meal, and count the number of times your baby kicks.   If it is less than 5 times, get up, move around and drink some juice. Repeat the test 30 minutes later. If it is still less than 5 kicks in an hour, notify your  doctor. °

## 2016-09-27 ENCOUNTER — Observation Stay
Admission: RE | Admit: 2016-09-27 | Discharge: 2016-09-27 | Disposition: A | Discharge status: Home / Self Care | Payer: BLUE CROSS/BLUE SHIELD | Attending: Obstetrics & Gynecology | Admitting: Obstetrics & Gynecology

## 2016-09-27 DIAGNOSIS — O09893 Supervision of other high risk pregnancies, third trimester: Secondary | ICD-10-CM | POA: Diagnosis present

## 2016-09-27 DIAGNOSIS — Z3A34 34 weeks gestation of pregnancy: Secondary | ICD-10-CM | POA: Diagnosis not present

## 2016-09-27 DIAGNOSIS — Z87891 Personal history of nicotine dependence: Secondary | ICD-10-CM | POA: Diagnosis not present

## 2016-09-27 DIAGNOSIS — Z7982 Long term (current) use of aspirin: Secondary | ICD-10-CM | POA: Insufficient documentation

## 2016-09-27 DIAGNOSIS — Z79899 Other long term (current) drug therapy: Secondary | ICD-10-CM | POA: Diagnosis not present

## 2016-09-27 DIAGNOSIS — O24414 Gestational diabetes mellitus in pregnancy, insulin controlled: Secondary | ICD-10-CM | POA: Insufficient documentation

## 2016-09-27 NOTE — Progress Notes (Signed)
Spoke with Dr.Ward. Update given, notified of reactive NST and uterine activity.  Orders received to perform cervical exam and notify her if dilation is discovered or patient to be d/c home if cervix is closed.

## 2016-09-27 NOTE — OB Triage Note (Signed)
Patient presented to L&D for scheduled biweekly NST.  Denies leaking of fluid, vaginal bleeding or decreased fetal movement.

## 2016-09-27 NOTE — Discharge Summary (Signed)
Discharge instructions and follow ups reviewed with patient who verbalized understanding.

## 2016-10-01 ENCOUNTER — Observation Stay
Admission: RE | Admit: 2016-10-01 | Discharge: 2016-10-01 | Disposition: A | Discharge status: Home / Self Care | Payer: Medicaid Other | Attending: Obstetrics & Gynecology | Admitting: Obstetrics & Gynecology

## 2016-10-01 ENCOUNTER — Encounter: Payer: Self-pay | Admitting: *Deleted

## 2016-10-01 DIAGNOSIS — F4310 Post-traumatic stress disorder, unspecified: Secondary | ICD-10-CM | POA: Diagnosis not present

## 2016-10-01 DIAGNOSIS — Z3A35 35 weeks gestation of pregnancy: Secondary | ICD-10-CM | POA: Diagnosis not present

## 2016-10-01 DIAGNOSIS — Z87891 Personal history of nicotine dependence: Secondary | ICD-10-CM | POA: Insufficient documentation

## 2016-10-01 DIAGNOSIS — E282 Polycystic ovarian syndrome: Secondary | ICD-10-CM | POA: Insufficient documentation

## 2016-10-01 DIAGNOSIS — M797 Fibromyalgia: Secondary | ICD-10-CM | POA: Insufficient documentation

## 2016-10-01 DIAGNOSIS — O99343 Other mental disorders complicating pregnancy, third trimester: Secondary | ICD-10-CM | POA: Insufficient documentation

## 2016-10-01 DIAGNOSIS — O99613 Diseases of the digestive system complicating pregnancy, third trimester: Secondary | ICD-10-CM | POA: Insufficient documentation

## 2016-10-01 DIAGNOSIS — Z79899 Other long term (current) drug therapy: Secondary | ICD-10-CM | POA: Diagnosis not present

## 2016-10-01 DIAGNOSIS — K589 Irritable bowel syndrome without diarrhea: Secondary | ICD-10-CM | POA: Diagnosis not present

## 2016-10-01 DIAGNOSIS — O99283 Endocrine, nutritional and metabolic diseases complicating pregnancy, third trimester: Secondary | ICD-10-CM | POA: Insufficient documentation

## 2016-10-01 DIAGNOSIS — O24414 Gestational diabetes mellitus in pregnancy, insulin controlled: Secondary | ICD-10-CM | POA: Diagnosis not present

## 2016-10-01 DIAGNOSIS — F329 Major depressive disorder, single episode, unspecified: Secondary | ICD-10-CM | POA: Insufficient documentation

## 2016-10-01 DIAGNOSIS — Z7982 Long term (current) use of aspirin: Secondary | ICD-10-CM | POA: Insufficient documentation

## 2016-10-01 DIAGNOSIS — O9989 Other specified diseases and conditions complicating pregnancy, childbirth and the puerperium: Secondary | ICD-10-CM | POA: Insufficient documentation

## 2016-10-01 NOTE — OB Triage Note (Signed)
Scheduled NST 
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
## 2016-10-01 NOTE — Discharge Instructions (Signed)
Drink plenty of fluid and get plenty of rest. Call your provider for any other concerns °

## 2016-10-04 ENCOUNTER — Observation Stay
Admission: EM | Admit: 2016-10-04 | Discharge: 2016-10-04 | Disposition: A | Discharge status: Home / Self Care | Payer: Medicaid Other | Attending: Obstetrics & Gynecology | Admitting: Obstetrics & Gynecology

## 2016-10-04 DIAGNOSIS — O3483 Maternal care for other abnormalities of pelvic organs, third trimester: Secondary | ICD-10-CM | POA: Diagnosis not present

## 2016-10-04 DIAGNOSIS — O24414 Gestational diabetes mellitus in pregnancy, insulin controlled: Secondary | ICD-10-CM | POA: Diagnosis not present

## 2016-10-04 DIAGNOSIS — Z7982 Long term (current) use of aspirin: Secondary | ICD-10-CM | POA: Insufficient documentation

## 2016-10-04 DIAGNOSIS — F4310 Post-traumatic stress disorder, unspecified: Secondary | ICD-10-CM | POA: Diagnosis not present

## 2016-10-04 DIAGNOSIS — Z3A35 35 weeks gestation of pregnancy: Secondary | ICD-10-CM | POA: Diagnosis not present

## 2016-10-04 DIAGNOSIS — O26893 Other specified pregnancy related conditions, third trimester: Secondary | ICD-10-CM | POA: Diagnosis not present

## 2016-10-04 DIAGNOSIS — Z794 Long term (current) use of insulin: Secondary | ICD-10-CM | POA: Insufficient documentation

## 2016-10-04 DIAGNOSIS — Z79899 Other long term (current) drug therapy: Secondary | ICD-10-CM | POA: Insufficient documentation

## 2016-10-04 DIAGNOSIS — E282 Polycystic ovarian syndrome: Secondary | ICD-10-CM | POA: Insufficient documentation

## 2016-10-04 DIAGNOSIS — F329 Major depressive disorder, single episode, unspecified: Secondary | ICD-10-CM | POA: Diagnosis not present

## 2016-10-04 DIAGNOSIS — K589 Irritable bowel syndrome without diarrhea: Secondary | ICD-10-CM | POA: Insufficient documentation

## 2016-10-04 DIAGNOSIS — O99343 Other mental disorders complicating pregnancy, third trimester: Secondary | ICD-10-CM | POA: Insufficient documentation

## 2016-10-04 DIAGNOSIS — Z87891 Personal history of nicotine dependence: Secondary | ICD-10-CM | POA: Diagnosis not present

## 2016-10-04 DIAGNOSIS — M797 Fibromyalgia: Secondary | ICD-10-CM | POA: Diagnosis not present

## 2016-10-04 DIAGNOSIS — Z7951 Long term (current) use of inhaled steroids: Secondary | ICD-10-CM | POA: Diagnosis not present
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
## 2016-10-04 NOTE — Discharge Summary (Signed)
Pt had a category 1 reactive NST. Monitors removed. Discharge instructions reviewed. Pt voiced understanding. Pt will return Tuesday September 11th for her next NST. Pt had no questions. Discharged home.

## 2016-10-04 NOTE — OB Triage Note (Signed)
Pt presents for scheduled NST for GDM. No bleeding, or LOF. Pt states she checked her blood sugar before breakfast and it was 110. She checked 2 hours after breakfast and it was 125.  Reports positive fetal movement. Vitals WNL. Will continue to monitor.

## 2016-10-08 ENCOUNTER — Inpatient Hospital Stay
Admission: RE | Admit: 2016-10-08 | Discharge: 2016-10-08 | Disposition: A | Discharge status: Home / Self Care | Payer: Non-veteran care | Source: Home / Self Care | Attending: Obstetrics and Gynecology | Admitting: Obstetrics and Gynecology

## 2016-10-08 DIAGNOSIS — O24419 Gestational diabetes mellitus in pregnancy, unspecified control: Secondary | ICD-10-CM

## 2016-10-08 NOTE — Final Progress Note (Signed)
NST 31yo G2P1001 at 36+1wks for insulin dependent diabetes in third trimester  Baseline: 140 Variability: moderate Accelerations present x >2 Decelerations absent Time 20mins  Interpretation: reactive NST, category 1 tracing  ----- Christeen DouglasBethany Zorawar Strollo, MD MPH Attending Obstetrician and Gynecologist [facility], Department of OB/GYN [facility]

## 2016-10-08 NOTE — OB Triage Note (Signed)
Pt here for scheduled NST.

## 2016-10-10 ENCOUNTER — Inpatient Hospital Stay: Admit: 2016-10-10 | Payer: Self-pay | Admitting: Obstetrics & Gynecology

## 2016-10-10 ENCOUNTER — Inpatient Hospital Stay
Admission: EM | Admit: 2016-10-10 | Discharge: 2016-10-13 | DRG: 765 | Disposition: A | Discharge status: Home / Self Care | Payer: Non-veteran care | Attending: Obstetrics and Gynecology | Admitting: Obstetrics and Gynecology

## 2016-10-10 ENCOUNTER — Inpatient Hospital Stay: Payer: Non-veteran care | Admitting: Anesthesiology

## 2016-10-10 ENCOUNTER — Encounter
Admission: EM | Disposition: A | Discharge status: Home / Self Care | Payer: Self-pay | Source: Home / Self Care | Attending: Obstetrics and Gynecology

## 2016-10-10 ENCOUNTER — Encounter: Payer: Self-pay | Admitting: *Deleted

## 2016-10-10 DIAGNOSIS — O99214 Obesity complicating childbirth: Secondary | ICD-10-CM | POA: Diagnosis present

## 2016-10-10 DIAGNOSIS — O34211 Maternal care for low transverse scar from previous cesarean delivery: Secondary | ICD-10-CM | POA: Diagnosis present

## 2016-10-10 DIAGNOSIS — O42913 Preterm premature rupture of membranes, unspecified as to length of time between rupture and onset of labor, third trimester: Principal | ICD-10-CM | POA: Diagnosis present

## 2016-10-10 DIAGNOSIS — O24424 Gestational diabetes mellitus in childbirth, insulin controlled: Secondary | ICD-10-CM | POA: Diagnosis present

## 2016-10-10 DIAGNOSIS — Z3A36 36 weeks gestation of pregnancy: Secondary | ICD-10-CM

## 2016-10-10 DIAGNOSIS — Z6841 Body Mass Index (BMI) 40.0 and over, adult: Secondary | ICD-10-CM

## 2016-10-10 DIAGNOSIS — Z87891 Personal history of nicotine dependence: Secondary | ICD-10-CM | POA: Diagnosis not present

## 2016-10-10 LAB — TYPE AND SCREEN
ABO/RH(D): B POS
ANTIBODY SCREEN: NEGATIVE

## 2016-10-10 LAB — CBC
HCT: 34.2 % — ABNORMAL LOW (ref 35.0–47.0)
HEMOGLOBIN: 12.2 g/dL (ref 12.0–16.0)
MCH: 30.6 pg (ref 26.0–34.0)
MCHC: 35.7 g/dL (ref 32.0–36.0)
MCV: 85.8 fL (ref 80.0–100.0)
Platelets: 188 10*3/uL (ref 150–440)
RBC: 3.99 MIL/uL (ref 3.80–5.20)
RDW: 15.8 % — ABNORMAL HIGH (ref 11.5–14.5)
WBC: 9.6 10*3/uL (ref 3.6–11.0)

## 2016-10-10 LAB — GLUCOSE, CAPILLARY
GLUCOSE-CAPILLARY: 91 mg/dL (ref 65–99)
GLUCOSE-CAPILLARY: 118 mg/dL — AB (ref 65–99)
Glucose-Capillary: 66 mg/dL (ref 65–99)
Glucose-Capillary: 63 mg/dL — ABNORMAL LOW (ref 65–99)

## 2016-10-10 SURGERY — Surgical Case
Anesthesia: General | Site: Abdomen | Wound class: Clean

## 2016-10-10 MED ORDER — OXYTOCIN BOLUS FROM INFUSION: 500.0000 mL | Freq: Once | INTRAVENOUS | Status: DC

## 2016-10-10 MED ORDER — DEXTROSE IN LACTATED RINGERS 5 % IV SOLN
INTRAVENOUS | Status: DC
  Administered 2016-10-10: 04:00:00 via INTRAVENOUS

## 2016-10-10 MED ORDER — LACTATED RINGERS IV SOLN: INTRAVENOUS | Status: DC

## 2016-10-10 MED ORDER — ONDANSETRON HCL 4 MG/2ML IJ SOLN
INTRAMUSCULAR | Status: AC
  Filled 2016-10-10: qty 2

## 2016-10-10 MED ORDER — CALCIUM CARBONATE ANTACID 500 MG PO CHEW: 1.0000 | CHEWABLE_TABLET | Freq: Two times a day (BID) | ORAL | Status: DC

## 2016-10-10 MED ORDER — IBUPROFEN 600 MG PO TABS
600.0000 mg | ORAL_TABLET | Freq: Four times a day (QID) | ORAL | Status: DC
  Administered 2016-10-10 – 2016-10-13 (×11): 600 mg via ORAL
  Filled 2016-10-10 (×11): qty 1

## 2016-10-10 MED ORDER — OXYTOCIN 10 UNIT/ML IJ SOLN
INTRAMUSCULAR | Status: AC
  Filled 2016-10-10: qty 7

## 2016-10-10 MED ORDER — PHENYLEPHRINE HCL 10 MG/ML IJ SOLN
INTRAMUSCULAR | Status: AC
  Filled 2016-10-10: qty 1

## 2016-10-10 MED ORDER — SODIUM CHLORIDE 0.9 % IJ SOLN
INTRAMUSCULAR | Status: AC
  Filled 2016-10-10: qty 50

## 2016-10-10 MED ORDER — OXYTOCIN 40 UNITS IN LACTATED RINGERS INFUSION - SIMPLE MED
INTRAVENOUS | Status: AC
  Filled 2016-10-10: qty 1000

## 2016-10-10 MED ORDER — MORPHINE SULFATE (PF) 0.5 MG/ML IJ SOLN
INTRAMUSCULAR | Status: DC | PRN
  Administered 2016-10-10: .15 mg via INTRATHECAL

## 2016-10-10 MED ORDER — PHENYLEPHRINE HCL 10 MG/ML IJ SOLN
INTRAMUSCULAR | Status: DC | PRN
  Administered 2016-10-10: 150 ug via INTRAVENOUS
  Administered 2016-10-10: 100 ug via INTRAVENOUS
  Administered 2016-10-10: 150 ug via INTRAVENOUS
  Administered 2016-10-10 (×2): 100 ug via INTRAVENOUS

## 2016-10-10 MED ORDER — SIMETHICONE 80 MG PO CHEW
80.0000 mg | CHEWABLE_TABLET | Freq: Three times a day (TID) | ORAL | Status: DC
  Administered 2016-10-11 – 2016-10-13 (×7): 80 mg via ORAL
  Filled 2016-10-10 (×8): qty 1

## 2016-10-10 MED ORDER — ACETAMINOPHEN 325 MG PO TABS
650.0000 mg | ORAL_TABLET | ORAL | Status: DC | PRN
  Administered 2016-10-11: 650 mg via ORAL
  Filled 2016-10-10: qty 2

## 2016-10-10 MED ORDER — SOD CITRATE-CITRIC ACID 500-334 MG/5ML PO SOLN: 30.0000 mL | ORAL | Status: DC | PRN

## 2016-10-10 MED ORDER — ONDANSETRON HCL 4 MG/2ML IJ SOLN: 4.0000 mg | Freq: Once | INTRAMUSCULAR | Status: DC | PRN

## 2016-10-10 MED ORDER — DEXTROSE 5 % IV SOLN
3.0000 g | INTRAVENOUS | Status: AC
  Administered 2016-10-10: 3 g via INTRAVENOUS
  Filled 2016-10-10: qty 3000

## 2016-10-10 MED ORDER — OXYCODONE-ACETAMINOPHEN 5-325 MG PO TABS
2.0000 | ORAL_TABLET | ORAL | Status: DC | PRN
  Administered 2016-10-11 – 2016-10-12 (×3): 2 via ORAL
  Filled 2016-10-10 (×2): qty 2

## 2016-10-10 MED ORDER — SODIUM CHLORIDE 0.9 % IV SOLN
INTRAVENOUS | Status: DC
  Administered 2016-10-10 (×2): via INTRAVENOUS

## 2016-10-10 MED ORDER — OXYTOCIN 40 UNITS IN LACTATED RINGERS INFUSION - SIMPLE MED
2.5000 [IU]/h | INTRAVENOUS | Status: DC
  Administered 2016-10-10: 700 mL via INTRAVENOUS
  Administered 2016-10-10: 1000 mL via INTRAVENOUS

## 2016-10-10 MED ORDER — OXYCODONE-ACETAMINOPHEN 5-325 MG PO TABS
1.0000 | ORAL_TABLET | ORAL | Status: DC | PRN
  Administered 2016-10-11: 1 via ORAL
  Filled 2016-10-10: qty 1

## 2016-10-10 MED ORDER — SODIUM CHLORIDE 0.9 % IV SOLN
INTRAVENOUS | Status: DC | PRN
  Administered 2016-10-10: 70 mL

## 2016-10-10 MED ORDER — BISACODYL 10 MG RE SUPP: 10.0000 mg | Freq: Every day | RECTAL | Status: DC | PRN

## 2016-10-10 MED ORDER — WITCH HAZEL-GLYCERIN EX PADS: 1.0000 "application " | MEDICATED_PAD | CUTANEOUS | Status: DC | PRN

## 2016-10-10 MED ORDER — LIDOCAINE HCL (PF) 1 % IJ SOLN: 30.0000 mL | INTRAMUSCULAR | Status: DC | PRN

## 2016-10-10 MED ORDER — SENNOSIDES-DOCUSATE SODIUM 8.6-50 MG PO TABS
2.0000 | ORAL_TABLET | ORAL | Status: DC
  Administered 2016-10-11 – 2016-10-12 (×2): 2 via ORAL
  Filled 2016-10-10 (×3): qty 2

## 2016-10-10 MED ORDER — SIMETHICONE 80 MG PO CHEW
80.0000 mg | CHEWABLE_TABLET | ORAL | Status: DC
  Administered 2016-10-12: 80 mg via ORAL
  Filled 2016-10-10: qty 1

## 2016-10-10 MED ORDER — COCONUT OIL OIL
1.0000 "application " | TOPICAL_OIL | Status: DC | PRN
  Administered 2016-10-12: 1 via TOPICAL
  Filled 2016-10-10: qty 120

## 2016-10-10 MED ORDER — INSULIN REGULAR HUMAN 100 UNIT/ML IJ SOLN
28.0000 [IU] | Freq: Once | INTRAMUSCULAR | Status: AC
  Administered 2016-10-10: 28 [IU] via SUBCUTANEOUS
  Filled 2016-10-10: qty 0.28

## 2016-10-10 MED ORDER — ONDANSETRON HCL 4 MG/2ML IJ SOLN
INTRAMUSCULAR | Status: AC
  Administered 2016-10-10: 4 mg via INTRAVENOUS
  Filled 2016-10-10: qty 2

## 2016-10-10 MED ORDER — BUTORPHANOL TARTRATE 2 MG/ML IJ SOLN: 1.0000 mg | INTRAMUSCULAR | Status: DC | PRN

## 2016-10-10 MED ORDER — ONDANSETRON HCL 4 MG/2ML IJ SOLN
INTRAMUSCULAR | Status: DC | PRN
  Administered 2016-10-10: 4 mg via INTRAVENOUS

## 2016-10-10 MED ORDER — DIBUCAINE 1 % RE OINT
1.0000 | TOPICAL_OINTMENT | RECTAL | Status: DC | PRN
Start: 2016-10-10 — End: 2016-10-13

## 2016-10-10 MED ORDER — SODIUM CHLORIDE 0.9 % IV SOLN: 2.0000 g | Freq: Once | INTRAVENOUS | Status: DC

## 2016-10-10 MED ORDER — MEASLES, MUMPS & RUBELLA VAC ~~LOC~~ INJ
0.5000 mL | INJECTION | Freq: Once | SUBCUTANEOUS | Status: DC
  Filled 2016-10-10: qty 0.5

## 2016-10-10 MED ORDER — ACETAMINOPHEN 325 MG PO TABS
650.0000 mg | ORAL_TABLET | ORAL | Status: DC | PRN
  Administered 2016-10-10: 650 mg via ORAL
  Filled 2016-10-10: qty 2

## 2016-10-10 MED ORDER — SOD CITRATE-CITRIC ACID 500-334 MG/5ML PO SOLN: 30.0000 mL | ORAL | Status: DC

## 2016-10-10 MED ORDER — OXYTOCIN 40 UNITS IN LACTATED RINGERS INFUSION - SIMPLE MED
2.5000 [IU]/h | INTRAVENOUS | Status: AC
  Administered 2016-10-10: 2.5 [IU]/h via INTRAVENOUS

## 2016-10-10 MED ORDER — METHYLENE BLUE 0.5 % INJ SOLN
INTRAVENOUS | Status: AC
  Filled 2016-10-10: qty 10

## 2016-10-10 MED ORDER — BUPIVACAINE LIPOSOME 1.3 % IJ SUSP
20.0000 mL | Freq: Once | INTRAMUSCULAR | Status: DC
  Filled 2016-10-10: qty 20

## 2016-10-10 MED ORDER — MENTHOL 3 MG MT LOZG
1.0000 | LOZENGE | OROMUCOSAL | Status: DC | PRN
  Filled 2016-10-10: qty 9

## 2016-10-10 MED ORDER — BUPIVACAINE HCL (PF) 0.5 % IJ SOLN
INTRAMUSCULAR | Status: AC
  Filled 2016-10-10: qty 30

## 2016-10-10 MED ORDER — BUPIVACAINE HCL (PF) 0.25 % IJ SOLN
INTRAMUSCULAR | Status: DC | PRN
  Administered 2016-10-10: 30 mL

## 2016-10-10 MED ORDER — CYCLOBENZAPRINE HCL 10 MG PO TABS
10.0000 mg | ORAL_TABLET | Freq: Every day | ORAL | Status: DC
  Administered 2016-10-10 – 2016-10-12 (×3): 10 mg via ORAL
  Filled 2016-10-10 (×3): qty 1

## 2016-10-10 MED ORDER — OXYTOCIN 10 UNIT/ML IJ SOLN
INTRAMUSCULAR | Status: AC
  Filled 2016-10-10: qty 1

## 2016-10-10 MED ORDER — DIPHENHYDRAMINE HCL 25 MG PO CAPS: 25.0000 mg | ORAL_CAPSULE | Freq: Four times a day (QID) | ORAL | Status: DC | PRN

## 2016-10-10 MED ORDER — FENTANYL CITRATE (PF) 100 MCG/2ML IJ SOLN
INTRAMUSCULAR | Status: DC | PRN
  Administered 2016-10-10: 20 ug via INTRATHECAL

## 2016-10-10 MED ORDER — DEXTROSE 5 % IV SOLN
500.0000 mg | INTRAVENOUS | Status: AC
  Administered 2016-10-10: 500 mg via INTRAVENOUS
  Filled 2016-10-10: qty 500

## 2016-10-10 MED ORDER — FLEET ENEMA 7-19 GM/118ML RE ENEM: 1.0000 | ENEMA | Freq: Every day | RECTAL | Status: DC | PRN

## 2016-10-10 MED ORDER — FENTANYL CITRATE (PF) 100 MCG/2ML IJ SOLN: 25.0000 ug | INTRAMUSCULAR | Status: DC | PRN

## 2016-10-10 MED ORDER — TETANUS-DIPHTH-ACELL PERTUSSIS 5-2.5-18.5 LF-MCG/0.5 IM SUSP
0.5000 mL | Freq: Once | INTRAMUSCULAR | Status: DC
  Filled 2016-10-10: qty 0.5

## 2016-10-10 MED ORDER — SIMETHICONE 80 MG PO CHEW: 80.0000 mg | CHEWABLE_TABLET | ORAL | Status: DC | PRN

## 2016-10-10 MED ORDER — INSULIN NPH (HUMAN) (ISOPHANE) 100 UNIT/ML ~~LOC~~ SUSP
48.0000 [IU] | Freq: Once | SUBCUTANEOUS | Status: AC
  Administered 2016-10-10: 48 [IU] via SUBCUTANEOUS
  Filled 2016-10-10 (×2): qty 10

## 2016-10-10 MED ORDER — FENTANYL CITRATE (PF) 100 MCG/2ML IJ SOLN
INTRAMUSCULAR | Status: AC
  Filled 2016-10-10: qty 2

## 2016-10-10 MED ORDER — PRENATAL MULTIVITAMIN CH
1.0000 | ORAL_TABLET | Freq: Every day | ORAL | Status: DC
  Administered 2016-10-11 – 2016-10-13 (×3): 1 via ORAL
  Filled 2016-10-10 (×3): qty 1

## 2016-10-10 MED ORDER — BUPIVACAINE IN DEXTROSE 0.75-8.25 % IT SOLN
INTRATHECAL | Status: DC | PRN
  Administered 2016-10-10: 1.6 mL via INTRATHECAL

## 2016-10-10 MED ORDER — LACTATED RINGERS IV SOLN: 500.0000 mL | INTRAVENOUS | Status: DC | PRN

## 2016-10-10 MED ORDER — MORPHINE SULFATE (PF) 0.5 MG/ML IJ SOLN
INTRAMUSCULAR | Status: AC
  Filled 2016-10-10: qty 10

## 2016-10-10 MED ORDER — ONDANSETRON HCL 4 MG/2ML IJ SOLN
4.0000 mg | Freq: Four times a day (QID) | INTRAMUSCULAR | Status: DC | PRN
  Administered 2016-10-10: 4 mg via INTRAVENOUS

## 2016-10-10 SURGICAL SUPPLY — 33 items
BARRIER ADHS 3X4 INTERCEED (GAUZE/BANDAGES/DRESSINGS) ×3 IMPLANT
CANISTER SUCT 3000ML PPV (MISCELLANEOUS) ×3 IMPLANT
CHLORAPREP W/TINT 26ML (MISCELLANEOUS) ×3 IMPLANT
COVER LIGHT HANDLE STERIS (MISCELLANEOUS) ×3 IMPLANT
DRSG TELFA 3X8 NADH (GAUZE/BANDAGES/DRESSINGS) ×3 IMPLANT
ELECT REM PT RETURN 9FT ADLT (ELECTROSURGICAL) ×3
ELECTRODE REM PT RTRN 9FT ADLT (ELECTROSURGICAL) ×1 IMPLANT
EXTENDER TRAXI PANNICULUS (MISCELLANEOUS) ×1 IMPLANT
GAUZE SPONGE 4X4 12PLY STRL (GAUZE/BANDAGES/DRESSINGS) ×3 IMPLANT
GOWN STRL REUS W/ TWL LRG LVL3 (GOWN DISPOSABLE) ×3 IMPLANT
GOWN STRL REUS W/TWL LRG LVL3 (GOWN DISPOSABLE) ×6
KIT PREVENA INCISION MGT20CM45 (CANNISTER) ×3 IMPLANT
NS IRRIG 1000ML POUR BTL (IV SOLUTION) ×3 IMPLANT
PAD OB MATERNITY 4.3X12.25 (PERSONAL CARE ITEMS) ×3 IMPLANT
PAD PREP 24X41 OB/GYN DISP (PERSONAL CARE ITEMS) ×3 IMPLANT
RTRCTR C-SECT PINK 34CM XLRG (MISCELLANEOUS) ×3 IMPLANT
SPONGE LAP 18X18 5 PK (GAUZE/BANDAGES/DRESSINGS) ×9 IMPLANT
STAPLER INSORB 30 2030 C-SECTI (MISCELLANEOUS) ×3 IMPLANT
SUCT VACUUM KIWI BELL (SUCTIONS) ×3 IMPLANT
SUT MNCRL 4-0 (SUTURE)
SUT MNCRL 4-0 27XMFL (SUTURE)
SUT PDS AB 1 TP1 96 (SUTURE) ×6 IMPLANT
SUT PLAIN 2 0 XLH (SUTURE) ×6 IMPLANT
SUT PLAIN GUT 2-0 30 C14 SG823 (SUTURE) ×3
SUT VIC AB 0 CT1 36 (SUTURE) ×12 IMPLANT
SUT VIC AB 2-0 CT1 36 (SUTURE) ×3 IMPLANT
SUT VIC AB 2-0 SH 27 (SUTURE) ×8
SUT VIC AB 2-0 SH 27XBRD (SUTURE) ×4 IMPLANT
SUT VIC AB 3-0 SH 27 (SUTURE) ×2
SUT VIC AB 3-0 SH 27X BRD (SUTURE) ×1 IMPLANT
SUTURE MNCRL 4-0 27XMF (SUTURE) IMPLANT
SUTURE PLN GUT2-0 30 C14 SG823 (SUTURE) ×1 IMPLANT
TRAXI PANNICULUS EXTENDER (MISCELLANEOUS) ×2

## 2016-10-10 NOTE — Op Note (Signed)
Cesarean Section Procedure Note  Date of procedure: 10/10/2016   Pre-operative Diagnosis: Intrauterine pregnancy at [redacted]w[redacted]d; Premature preterm rupture of membranes; insulin dep diabetes- gDMA2; prior mesh placement along fascial incision; prior pain and hemorrhage after delivery requiring return to the OR  Post-operative Diagnosis: same, delivered; abdominal adhesions that required significant OR time;  Procedure: Repeat Low Transverse Cesarean Section through Pfannenstiel incision,using a vacuum on the fetal vertex for delivery through mesh  Surgeon: Christeen Douglas, MD  Assistant(s):  CST  Anesthesia: Spinal anesthesia and liposomal local  Anesthesiologist: Lenard Simmer, MD Anesthesiologist: Yevette Edwards, MD; Lenard Simmer, MD CRNA: Omer Jack, CRNA  Estimated Blood Loss:  800         Drains: none - provena wound vac placed         Total IV Fluids:  Urine Output:         Specimens: none         Complications:  Significant adhesions between omentum and anterior abdominal wall , fascia and rectus muscles requiring alteration in surgical technique. Mesh along fascia limiting fetal delivery and requiring incision of the rectus muscles by 3 cm on either side.          Disposition: PACU - hemodynamically stable.         Condition: stable  Findings:  A female infant, as yet unnamed,  in cephalic presentation. Amniotic fluid - Clear  Birth weight 8#1oz.  Apgars of 8 and 9 at one and five minutes respectively.  Intact placenta with a three-vessel cord.  Grossly normal uterus, tubes and ovaries bilaterally. Significant intraabdominal adhesions were noted.  Indications: Prior low transverse incision and PPROM remote from delivery with a 30% chance of successful VBAC.  Procedure Details  The patient was taken to Operating Room, identified as the correct patient and the procedure verified as C-Section Delivery. A formal Time Out was held with all team members  present and in agreement.  After induction of anesthesia, the patient was draped and prepped in the usual sterile manner. A Traxi retractor was placed. A Pfannenstiel skin incision was made and carried down through the subcutaneous tissue to the fascia. Fascial incision was made and extended transversely with the Mayo scissors and with bovie cut, being careful not to enter the omentum that was adherent. The fascia was separated from the underlying rectus tissue superiorly and inferiorly sharply and carefully. The peritoneum was never identified and the omentum was adherent in the midline to the fascia. Peritoneal incision was extended longitudinally with clamping across the omentum and bovie. The utero-vesical peritoneal reflection was incised transversely and a bladder flap was created digitally. The rectus muscle/omentum/mesh complex was incised sharply and with Bovie cautery. The bladder was protected throughout behind the bladder blade and an Teacher, early years/pre placed.  A low transverse hysterotomy was made. The fetus was delivered using fundus pressure and a vacuum, with two pop-offs. 55 sec of delayed cord clamping. The umbilical cord was clamped x2 and cut and the infant was handed to the awaiting pediatricians. The placenta was removed intact and appeared normal, intact, and with a 3-vessel cord.   The uterus was exteriorized and cleared of all clot and debris. The hysterotomy was closed with running sutures of 0-Vicryl. A second imbricating layer was placed with the same suture. Excellent hemostasis was observed. The peritoneal cavity was cleared of all clots and debris. The uterus was returned to the abdomen.   The pelvis was irrigated and again, excellent hemostasis was  noted. Several stiches were placed in the omentum for hemostasis. The fascia was then reapproximated with running sutures of 1 PDS. The subcutaneous tissue was reapproximated with running sutures of 2-0 plain gut. The skin was  reapproximated with Insorb absorbable sutures. 20ml (in 30 of 0.5% bupivicaine and 50ml of NSS) of liposomal bupivicaine placed in the fascial and skin lines. The Provena wound retractor was placed.  Instrument, sponge, and needle counts were correct prior to the abdominal closure and at the conclusion of the case.   The patient tolerated the procedure well and was transferred to the recovery room in stable condition.   Christeen DouglasBethany Britzy Graul, MD 10/10/2016

## 2016-10-10 NOTE — Anesthesia Procedure Notes (Signed)
Spinal  Patient location during procedure: OR Staffing Anesthesiologist: Molli Barrows Performed: anesthesiologist  Preanesthetic Checklist Completed: patient identified, site marked, surgical consent, pre-op evaluation, timeout performed, IV checked, risks and benefits discussed and monitors and equipment checked Spinal Block Patient position: sitting Prep: Betadine Patient monitoring: heart rate, continuous pulse ox, blood pressure and cardiac monitor Approach: midline Location: L5-S1 Injection technique: single-shot Needle Needle type: Whitacre and Introducer  Needle gauge: 24 G Needle length: 10 cm Assessment Sensory level: T4 Additional Notes Negative paresthesia. Negative blood return. Positive free-flowing CSF. Expiration date of kit checked and confirmed. Patient tolerated procedure well, without complications.

## 2016-10-10 NOTE — Transfer of Care (Signed)
Immediate Anesthesia Transfer of Care Note  Patient: Melissa MoralesMelissa M Berhane  Procedure(s) Performed: Procedure(s) with comments: REPEAT CESAREAN SECTION (N/A) - Female born @ 1444 Apgars: 8/9 Weight: 8 lbs 1 oz   Patient Location: PACU and Mother/Baby  Anesthesia Type:Spinal  Level of Consciousness: awake, alert  and oriented  Airway & Oxygen Therapy: Patient Spontanous Breathing  Post-op Assessment: Report given to RN and Post -op Vital signs reviewed and stable  Post vital signs: Reviewed and stable  Last Vitals:  Vitals:   10/10/16 1611 10/10/16 1613  BP: 109/78 (!) 109/58  Pulse: 89 91  Resp: 16 20  Temp: 36.4 C 36.5 C  SpO2: 99% 99%    Last Pain:  Vitals:   10/10/16 1212  TempSrc:   PainSc: 4          Complications: No apparent anesthesia complications

## 2016-10-10 NOTE — Discharge Summary (Signed)
Obstetrical Discharge Summary  Patient Name: Melissa Carr DOB: 1985-05-20 MRN: [id number]  Date of Admission: 10/10/2016 Date of Discharge: 10/13/16 Primary OB[facility]  Gestational Age at Delivery: [redacted]w[redacted]d   Antepartum complications:  - gestational diabetes, poorly controlled - prior cesarean section with multiple surgical complications - Class III obesity - PTSD - IBS/fibromyalgia   Admitting Diagnosis: PPROM remote from delivery Secondary Diagnosis: Patient Active Problem List   Diagnosis Date Noted  . Indication for care in labor or delivery 10/10/2016  . Labor and delivery indication for care or intervention 09/27/2016  . Labor and delivery, indication for care 09/24/2016    Complications: None Intrapartum complications/course:  rLTCS with lysis of adhesions and placement of a wound vac  Date of Delivery: 10/10/16 Delivered By: Christeen Douglas Delivery Type: repeat cesarean section, low transverse incision Anesthesia: spinal and exparel Placenta: Extracted Episiotomy: none Newborn Data: Live born female  Birth Weight: 8 lb 0.8 oz (3650 g) APGAR: 8, 9    Discharge Physical Exam: 10/13/2016  BP (!) 84/50 (BP Location: Left Arm)   Pulse 94   Temp 98.4 F (36.9 C) (Oral)   Resp 19   Ht  (1.626 m)   Wt 121.6 kg (268 lb)   LMP 01/27/2016   SpO2 95%   Breastfeeding? Unknown   BMI 46.00 kg/m   General: NAD CV: RRR Pulm: CTABL, nl effort ABD: s/nd/nt, fundus firm and below the umbilicus Lochia: moderate Incision: c/d/i wound vac on and no drainage  DVT Evaluation: LE non-ttp, no evidence of DVT on exam.  Hemoglobin  Date Value Ref Range Status  10/11/2016 11.1 (L) 12.0 - 16.0 g/dL Final   HCT  Date Value Ref Range Status  10/11/2016 31.7 (L) 35.0 - 47.0 % Final    Post partum course:uncomplicted Postpartum Procedures: none Disposition: stable, discharge to home. Baby Feeding: breastmilk Baby Disposition: home with mom  Rh  Immune globulin given: n/a Rubella vaccine given: n/a Tdap vaccine given in AP or PP setting:  Flu vaccine given in AP or PP setting:   Contraception: depo provera given before d/c   Prenatal Labs:  B pos, Antibody neg,RI, RPR NR, HBsAG neg, HIV neg, GBS neg, Varicella immune   Plan:  Melissa Carr was discharged to home in good condition. Follow-up appointment at [facility] Dr Dalbert Garnet for wound vac check    Discharge Medications: Allergies as of 10/13/2016   No Known Allergies     Medication List    STOP taking these medications   aspirin EC 81 MG tablet   insulin NPH Human 100 UNIT/ML injection Commonly known as:  HUMULIN N,NOVOLIN N   insulin regular 100 units/mL injection Commonly known as:  NOVOLIN R,HUMULIN R   metroNIDAZOLE 0.75 % vaginal gel Commonly known as:  METROGEL     TAKE these medications   cyclobenzaprine 10 MG tablet Commonly known as:  FLEXERIL Take 10 mg by mouth at bedtime.   ferrous sulfate 325 (65 FE) MG tablet Take 325 mg by mouth daily with breakfast.   fluticasone 50 MCG/ACT nasal spray Commonly known as:  FLONASE Place 2 sprays into both nostrils daily as needed.   ibuprofen 600 MG tablet Commonly known as:  ADVIL,MOTRIN Take 1 tablet (600 mg total) by mouth every 6 (six) hours.   oxyCODONE-acetaminophen 5-325 MG tablet Commonly known as:  PERCOCET/ROXICET Take 1-2 tablets by mouth every 6 (six) hours as needed for moderate pain.   prenatal multivitamin Tabs  tablet Take 1 tablet by mouth daily at 12 noon.            Discharge Care Instructions        Start     Ordered   10/13/16 0000  ibuprofen (ADVIL,MOTRIN) 600 MG tablet  Every 6 hours     10/13/16 0841   10/13/16 0000  oxyCODONE-acetaminophen (PERCOCET/ROXICET) 5-325 MG tablet  Every 6 hours PRN     10/13/16 0841   10/13/16 0000  Diet - low sodium heart healthy     10/13/16 0841   10/13/16 0000  Call MD for:  extreme fatigue     10/13/16  0841   10/13/16 0000  Call MD for:  persistant dizziness or light-headedness     10/13/16 0841   10/13/16 0000  Call MD for:  hives     10/13/16 0841   10/13/16 0000  Call MD for:  difficulty breathing, headache or visual disturbances     10/13/16 0841   10/13/16 0000  Call MD for:  redness, tenderness, or signs of infection (pain, swelling, redness, odor or green/yellow discharge around incision site)     10/13/16 0841   10/13/16 0000  Call MD for:  severe uncontrolled pain     10/13/16 0841   10/13/16 0000  Call MD for:  persistant nausea and vomiting     10/13/16 0841   10/13/16 0000  Call MD for:  temperature >100.4     10/13/16 0841   10/13/16 0000  Call MD for:    Comments:  Severe h/a   10/13/16 0841      Follow-up Information    Christeen DouglasBeasley, Bethany, MD Follow up in 1 week(s).   Specialty:  Obstetrics and Gynecology Why:  for wound vac check  Contact information: [address] [place] [id number] [phone]           Signed: Ihor Austinhomas J Saba Gomm MD 10/13/2016

## 2016-10-10 NOTE — Progress Notes (Signed)
After patient talked with Dr Dalbert GarnetBeasley patient has decided she wants to have a repeat C section.
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
## 2016-10-10 NOTE — Discharge Summary (Signed)
Obstetric Discharge Summary   Patient ID: Melissa MoralesMelissa M Carr MRN: [id number] DOB/AGE: 03/14/1985 31 y.o.   Date of Admission: 09/23/2016  Date of Discharge: 09/24/2016  Admitting Diagnosis: IUP  at 5231w5d  Secondary Diagnosis:Insulin dep DM, Obesity, TOLAC  Mode of Delivery: N/A     Discharge Diagnosis: IUP at 36 5/7 weeks with Th PTL   Intrapartum Procedures: N/A Post partum procedures:N/A  Complications: Insulin Dep Diabetes, Obesity    Brief Hospital Course  Melissa Carr is a G2P1001 who arrived with poss labor S/S and was diagnosed with having B-H's with no cx change. Pt was deemed safe for dc to  home.     Labs: CBC Latest Ref Rng & Units 10/10/2016 03/27/2016  WBC 3.6 - 11.0 K/uL 9.6 11.2(H)  Hemoglobin 12.0 - 16.0 g/dL 95.212.2 84.112.2  Hematocrit 32.435.0 - 47.0 % 34.2(L) 35.4  Platelets 150 - 440 K/uL 188 250   B POS  Physical exam:  Blood pressure 120/62, pulse 92, temperature 98.5 F (36.9 C), temperature source Oral, resp. rate 18, height 5\' 4"  (1.626 m), weight 117.9 kg (260 lb), last menstrual period 01/27/2016, SpO2 98 %. General: alert and no distress Resp reg and non-labored Abdomen: Gravid Uterine Fundus: Gravid Extremities: No evidence of DVT seen on physical exam. No lower extremity edema.  Discharge Instructions: Per After Visit Summary. Activity: Advance as tolerated.  Diet: Regular Medications: Allergies as of 09/24/2016   No Known Allergies     Medication List    ASK your doctor about these medications   aspirin EC 81 MG tablet Take 81 mg by mouth daily.   cyclobenzaprine 10 MG tablet Commonly known as:  FLEXERIL Take 10 mg by mouth at bedtime.   ferrous sulfate 325 (65 FE) MG tablet Take 325 mg by mouth daily with breakfast.   fluticasone 50 MCG/ACT nasal spray Commonly known as:  FLONASE Place 2 sprays into both nostrils daily as needed.   insulin NPH Human 100 UNIT/ML injection Commonly known as:  HUMULIN N,NOVOLIN N Inject into the  skin 2 (two) times daily before a meal. 36 units before breakfast and 14 units before supper   insulin regular 100 units/mL injection Commonly known as:  NOVOLIN R,HUMULIN R Inject 12 Units into the skin 2 (two) times daily before a meal.   metroNIDAZOLE 0.75 % vaginal gel Commonly known as:  METROGEL Place 1 Applicatorful vaginally 2 (two) times daily.   prenatal multivitamin Tabs tablet Take 1 tablet by mouth daily at 12 noon.      Outpatient follow up:  Follow-up Information    Labor and delivery Follow up.   Why:  Immediately upon discharge from the ER       Sharee PimpleJones, Kamal Jurgens W, CNM. Go on 09/25/2016.   Specialty:  Obstetrics and Gynecology Contact information: [address] [facility] [place] [place] [address] [phone]        Sharee PimpleJones, Brianca Fortenberry W, CNM .   Specialty:  Obstetrics and Gynecology Contact information: [address] [facility] [place] [address] [phone]          Postpartum contraception: Unknown   Discharged Condition: Stable   Discharged to: Home   Barbaraann RondoCaron W NekomaJones, [place] 10/10/2016
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
## 2016-10-10 NOTE — OB Triage Note (Signed)
Recvd pt from ED. Pt c/o leaking of fluid that started around 0000. Says she feels some irregular cramping. No bleeding. Feeling baby move well. No intercourse in the past 24 hours.

## 2016-10-10 NOTE — Progress Notes (Signed)
31 y.o. G2P1001 at 2050w5d with Estimated Date of Delivery: 11/02/16 was seen today in office to discuss trial of labor after cesarean section (TOLAC) versus elective repeat cesarean delivery (ERCD). The following risks were discussed with the patient.  Risk of uterine rupture at term is 0.78 percent with TOLAC and 0.22 percent with ERCD. 1 in 10 uterine ruptures will result in neonatal death or neurological injury. The benefits of a trial of labor after cesarean (TOLAC) resulting in a vaginal birth after cesarean (VBAC) include the following: shorter length of hospital stay and postpartum recovery (in most cases); fewer complications, such as postpartum fever, wound or uterine infection, thromboembolism (blood clots in the leg or lung), need for blood transfusion and fewer neonatal breathing problems. The risks of an attempted VBAC or TOLAC include the following: Risk of failed trial of labor after cesarean (TOLAC) without a vaginal birth after cesarean (VBAC) resulting in repeat cesarean delivery (RCD) in about 20 to 40 percent of women who attempt VBAC.  Her individualized success rate using the MFMU VBAC risk calculator is 30%.   Risk of rupture of uterus resulting in an emergency cesarean delivery. The risk of uterine rupture may be related in part to the type of uterine incision made during the first cesarean delivery. A previous transverse uterine incision has the lowest risk of rupture (0.2 to 1.5 percent risk). Vertical or T-shaped uterine incisions have a higher risk of uterine rupture (4 to 9 percent risk)The risk of fetal death is very low with both VBAC and elective repeat cesarean delivery (ERCD), but the likelihood of fetal death is higher with VBAC than with ERCD. Maternal death is very rare with either type of delivery. The risks of an elective repeat cesarean delivery (ERCD) were reviewed with the patient including but not limited to: 02/998 risk of uterine rupture which could have serious  consequences, bleeding which may require transfusion; infection which may require antibiotics; injury to bowel, bladder or other surrounding organs (bowel, bladder, ureters); injury to the fetus; need for additional procedures including hysterectomy in the event of a life-threatening hemorrhage; thromboembolic phenomenon; abnormal placentation; incisional problems; death and other postoperative or anesthesia complications.    In addition we discussed that our collective office practice is to allow patient's who desire to attempt TOLAC to go into labor naturally.  Medical indications necessetating early delivery may arise during the course of any pregnancy.  Given the contraindication on the use of prostaglandins for use in cervical ripening,  recommendation would be to proceed with repeat cesarean for delivery for patient's with unfavorable cervix (low Bishops score) who reach 41 weeks or who otherwise have a medical indication for early delivery.   Because of the low risk of success, the clinical situation of PPROM and not in labor, and her prior delivery hx with hemorrhage and difficult recovery, I counseled her and she agrees to proceed with rLTCS.   Cline CoolsBethany E. Marilu Rylander, MD MPH, Evern CoreFACOG [facility]

## 2016-10-10 NOTE — Anesthesia Post-op Follow-up Note (Signed)
Anesthesia QCDR form completed.        

## 2016-10-11 ENCOUNTER — Encounter: Payer: Self-pay | Admitting: Obstetrics and Gynecology

## 2016-10-11 LAB — CBC
HCT: 31.7 % — ABNORMAL LOW (ref 35.0–47.0)
Hemoglobin: 11.1 g/dL — ABNORMAL LOW (ref 12.0–16.0)
MCH: 30.6 pg (ref 26.0–34.0)
MCHC: 35.2 g/dL (ref 32.0–36.0)
MCV: 86.9 fL (ref 80.0–100.0)
PLATELETS: 180 10*3/uL (ref 150–440)
RBC: 3.64 MIL/uL — AB (ref 3.80–5.20)
RDW: 16.1 % — ABNORMAL HIGH (ref 11.5–14.5)
WBC: 11.5 10*3/uL — ABNORMAL HIGH (ref 3.6–11.0)

## 2016-10-11 LAB — HIV ANTIBODY (ROUTINE TESTING W REFLEX): HIV SCREEN 4TH GENERATION: NONREACTIVE

## 2016-10-11 LAB — RPR: RPR Ser Ql: NONREACTIVE

## 2016-10-11 LAB — GLUCOSE, CAPILLARY: Glucose-Capillary: 65 mg/dL (ref 65–99)

## 2016-10-11 MED ORDER — OXYCODONE-ACETAMINOPHEN 5-325 MG PO TABS
1.0000 | ORAL_TABLET | Freq: Four times a day (QID) | ORAL | Status: DC | PRN
  Administered 2016-10-12 (×2): 2 via ORAL
  Administered 2016-10-12 – 2016-10-13 (×3): 1 via ORAL
  Administered 2016-10-13: 2 via ORAL
  Filled 2016-10-11: qty 1
  Filled 2016-10-11 (×2): qty 2
  Filled 2016-10-11 (×2): qty 1
  Filled 2016-10-11 (×2): qty 2

## 2016-10-11 MED ORDER — INFLUENZA VAC SPLIT QUAD 0.5 ML IM SUSY
0.5000 mL | PREFILLED_SYRINGE | INTRAMUSCULAR | Status: AC
  Administered 2016-10-13: 0.5 mL via INTRAMUSCULAR
  Filled 2016-10-11: qty 0.5

## 2016-10-11 NOTE — Anesthesia Post-op Follow-up Note (Signed)
  Anesthesia Pain Follow-up Note  Patient: Melissa Carr  Day #: 1  Date of Follow-up: 10/11/2016 Time: 7:27 AM  Last Vitals:  Vitals:   10/11/16 0118 10/11/16 0411  BP: (!) 95/56 90/62  Pulse: 91 (!) 101  Resp: 18 20  Temp: 37.1 C 36.7 C  SpO2:  94%    Level of Consciousness: alert  Pain: none   Side Effects:None  Catheter Site Exam:clean, dry, no drainage     Plan: D/C from anesthesia care at surgeon's request  Karoline Caldwell

## 2016-10-11 NOTE — Progress Notes (Signed)
Subjective: Postpartum Day 1: Cesarean Delivery Patient reports no problems voiding.   Wound vac on Objective: Vital signs in last 24 hours: Temp:  [97.6 F (36.4 C)-98.7 F (37.1 C)] 98.5 F (36.9 C) (09/14 0757) Pulse Rate:  [89-101] 94 (09/14 0757) Resp:  [15-23] 18 (09/14 0757) BP: (90-124)/(51-81) 91/51 (09/14 0757) SpO2:  [93 %-99 %] 97 % (09/14 0757)  Physical Exam:  General: alert and cooperative Lochia: appropriate Uterine Fundus: firm Incision: no significant drainage DVT Evaluation: No evidence of DVT seen on physical exam. No output from wound vac  Recent Labs  10/10/16 0300 10/11/16 0612  HGB 12.2 11.1*  HCT 34.2* 31.7*    Assessment/Plan: Status post Cesarean section. Doing well postoperatively.  Continue current care.  Melissa Carr 10/11/2016, 10:05 AM

## 2016-10-11 NOTE — Anesthesia Postprocedure Evaluation (Signed)
Anesthesia Post Note  Patient: Melissa Carr  Procedure(s) Performed: Procedure(s) (LRB): REPEAT CESAREAN SECTION (N/A)  Patient location during evaluation: [facility] Anesthesia Type: Spinal Level of consciousness: awake, awake and alert and oriented Pain management: pain level controlled Vital Signs Assessment: post-procedure vital signs reviewed and stable Respiratory status: spontaneous breathing Cardiovascular status: blood pressure returned to baseline Postop Assessment: no headache, no backache, no apparent nausea or vomiting and adequate PO intake     Last Vitals:  Vitals:   10/11/16 0118 10/11/16 0411  BP: (!) 95/56 90/62  Pulse: 91 (!) 101  Resp: 18 20  Temp: 37.1 C 36.7 C  SpO2:  94%    Last Pain:  Vitals:   10/11/16 0411  TempSrc: Oral  PainSc:                  Karoline Caldwell

## 2016-10-12 NOTE — Progress Notes (Signed)
Subjective:no c/o  Postpartum Day 2: Cesarean Delivery Patient reports no problems voiding.   Pain ok  Objective: Vital signs in last 24 hours: Temp:  [97.4 F (36.3 C)-98.5 F (36.9 C)] 98 F (36.7 C) (09/15 1224) Pulse Rate:  [92-106] 92 (09/15 0930) Resp:  [18] 18 (09/15 0930) BP: (118-127)/(64-80) 118/64 (09/15 0930) SpO2:  [100 %] 100 % (09/15 0930)  Physical Exam:  General: alert and cooperative Lochia: appropriate Uterine Fundus: firm Incision: no significant drainage wound vac on  DVT Evaluation: No evidence of DVT seen on physical exam.   Recent Labs  10/10/16 0300 10/11/16 0612  HGB 12.2 11.1*  HCT 34.2* 31.7*    Assessment/Plan: Status post Cesarean section. Doing well postoperatively.  Continue current care.  Melissa Carr 10/12/2016, 2:21 PM

## 2016-10-13 DIAGNOSIS — O42913 Preterm premature rupture of membranes, unspecified as to length of time between rupture and onset of labor, third trimester: Secondary | ICD-10-CM | POA: Diagnosis not present

## 2016-10-13 MED ORDER — IBUPROFEN 600 MG PO TABS: 600.0000 mg | ORAL_TABLET | Freq: Four times a day (QID) | ORAL | 0 refills | Status: DC

## 2016-10-13 MED ORDER — DOCUSATE SODIUM 100 MG PO CAPS: 100.0000 mg | ORAL_CAPSULE | Freq: Two times a day (BID) | ORAL | Status: DC | PRN

## 2016-10-13 MED ORDER — MEDROXYPROGESTERONE ACETATE 150 MG/ML IM SUSP
150.0000 mg | Freq: Once | INTRAMUSCULAR | Status: AC
  Administered 2016-10-13: 150 mg via INTRAMUSCULAR
  Filled 2016-10-13: qty 1

## 2016-10-13 MED ORDER — OXYCODONE-ACETAMINOPHEN 5-325 MG PO TABS: 1.0000 | ORAL_TABLET | Freq: Four times a day (QID) | ORAL | 0 refills | Status: DC | PRN

## 2016-10-13 NOTE — Progress Notes (Signed)
Discharge instructions reviewed with patient who verbalized understanding. Prescriptions reviewed & given to patient, denies any questions or concerns.
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
## 2016-10-25 ENCOUNTER — Inpatient Hospital Stay: Admission: RE | Admit: 2016-10-25 | Payer: BLUE CROSS/BLUE SHIELD | Source: Ambulatory Visit

## 2017-08-30 IMAGING — CR DG CHEST 2V
1 series · 2 of 2 positions shown · non-contrast
Comparison: None.

CLINICAL DATA: Cough, congestion and fever. Central chest pain
radiating bilaterally.

EXAM:
CHEST  2 VIEW

[Series 1: dg chest 2 view · 0.14mm/px · 2 of 2 slices shown]
[im 1/2]
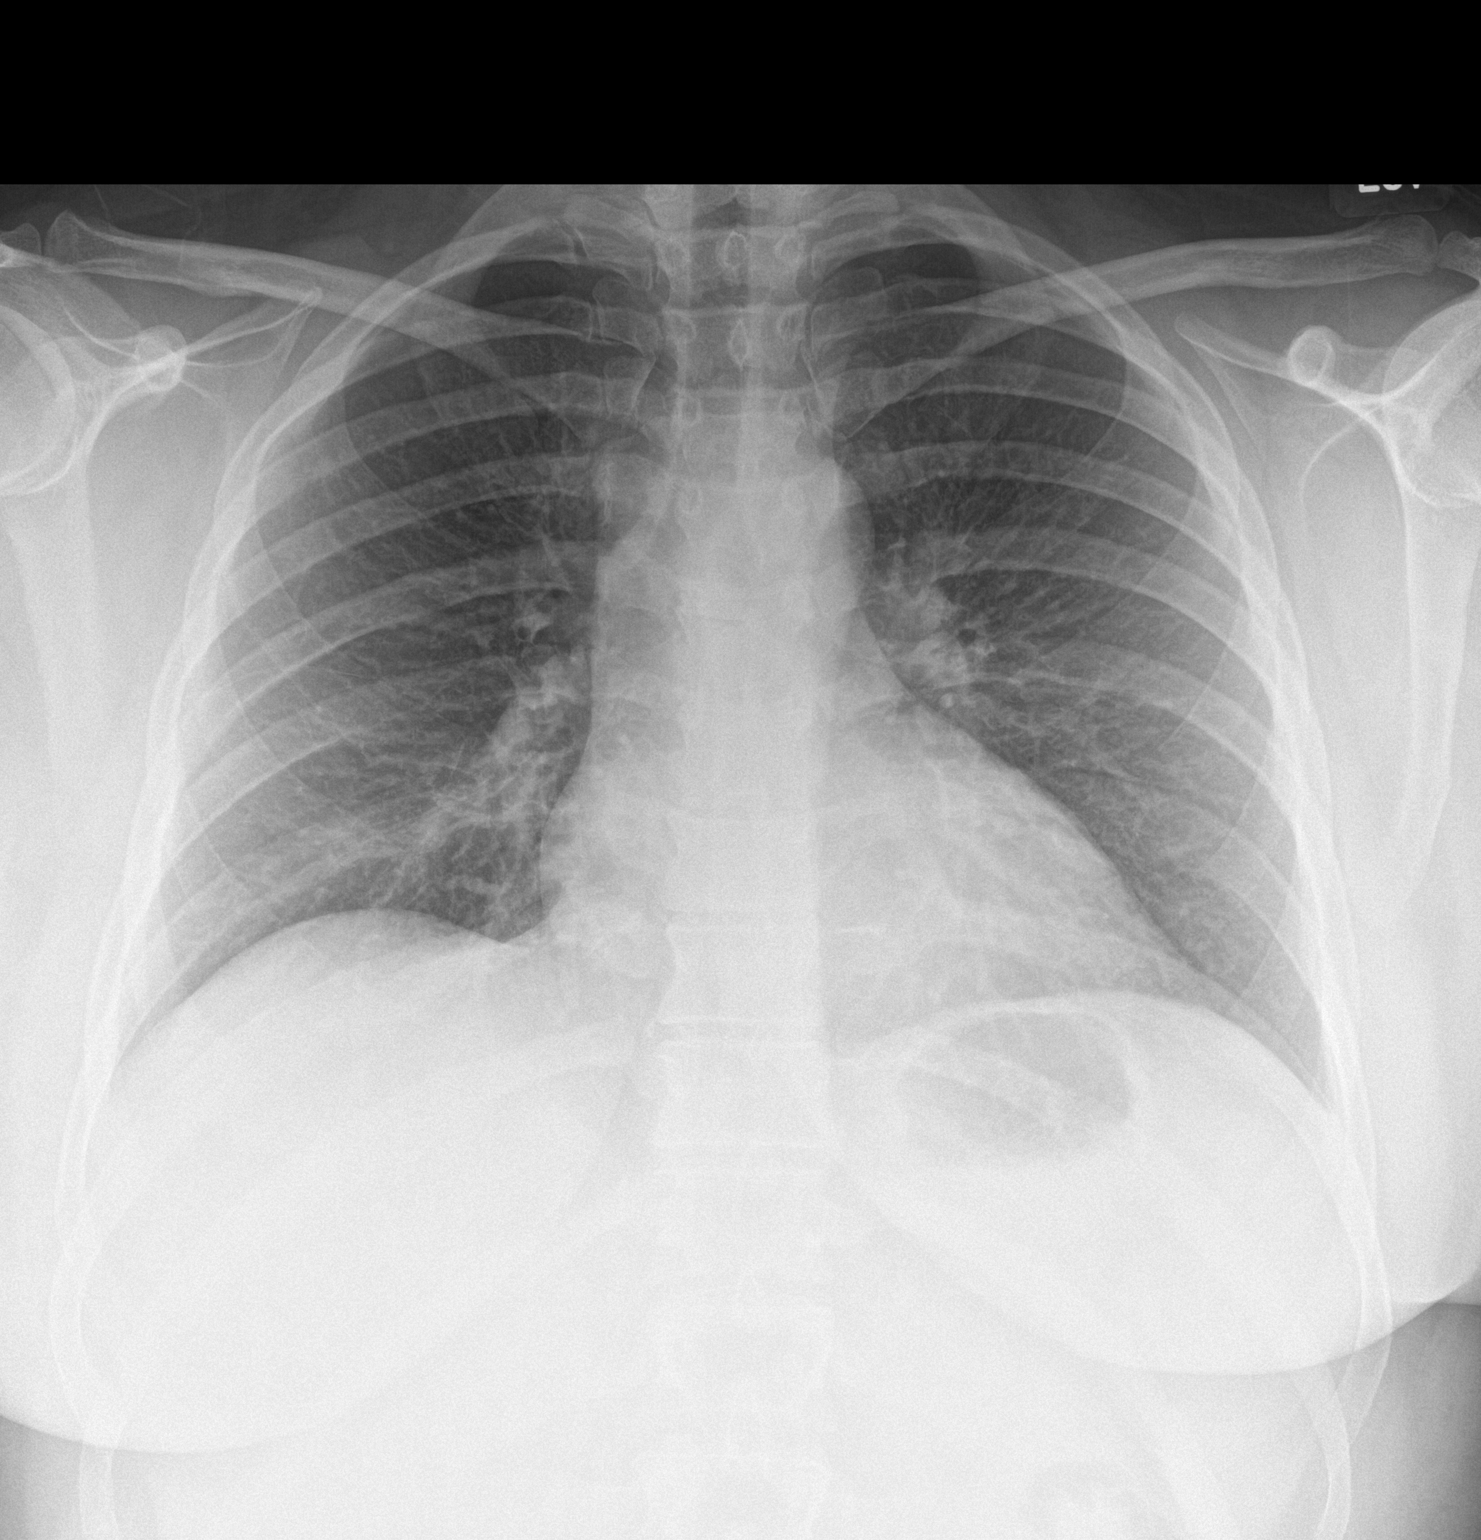
[im 2/2]
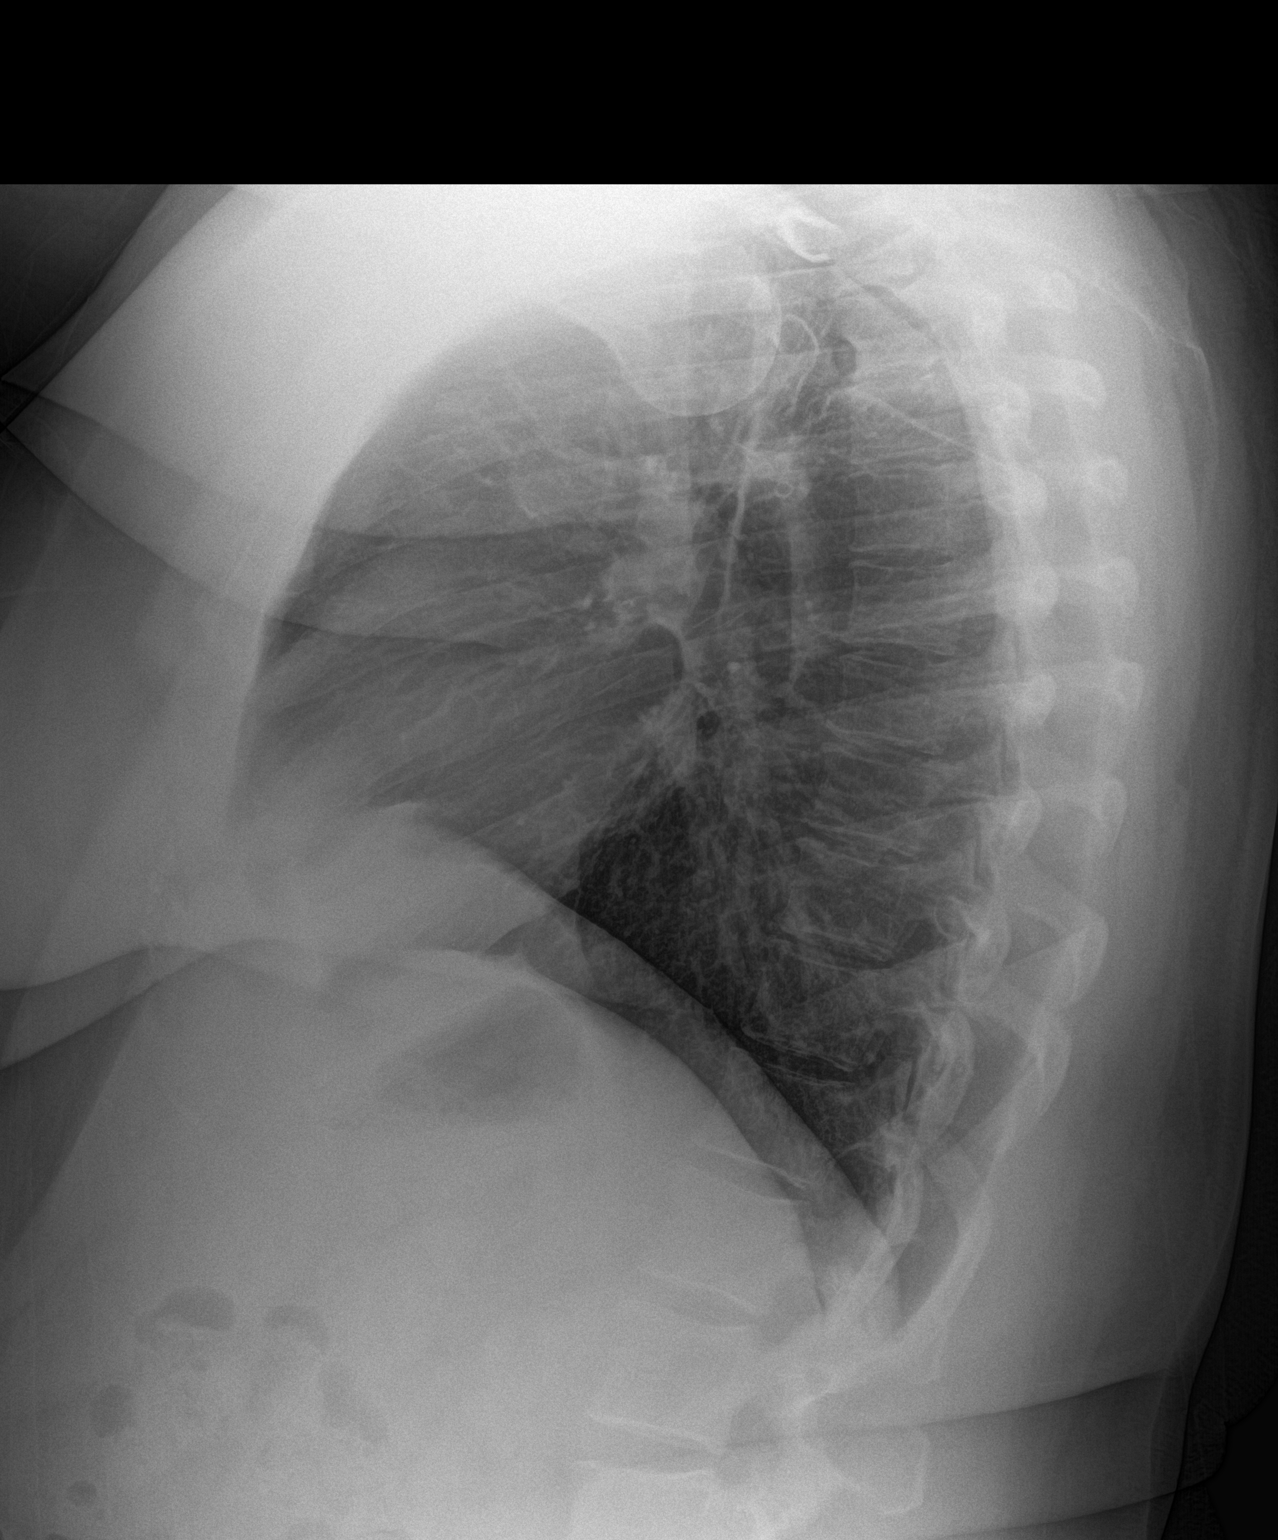

[2 of 2 positions shown; findings below may reference images not displayed]

FINDINGS: Central bronchial thickening. No infiltrate, collapse or effusion.
Heart size is normal. Mediastinal shadows are normal. No bone
abnormality.
IMPRESSION: Bronchitis pattern.  No consolidation or collapse.

## 2018-04-05 IMAGING — CR DG HIP (WITH OR WITHOUT PELVIS) 2-3V*R*
2 series · 2 of 2 positions shown · non-contrast
Comparison: None.

CLINICAL DATA: Pain after ground level fall at [DATE] tonight.

EXAM:
DG HIP (WITH OR WITHOUT PELVIS) 2-3V RIGHT

[hip ap]
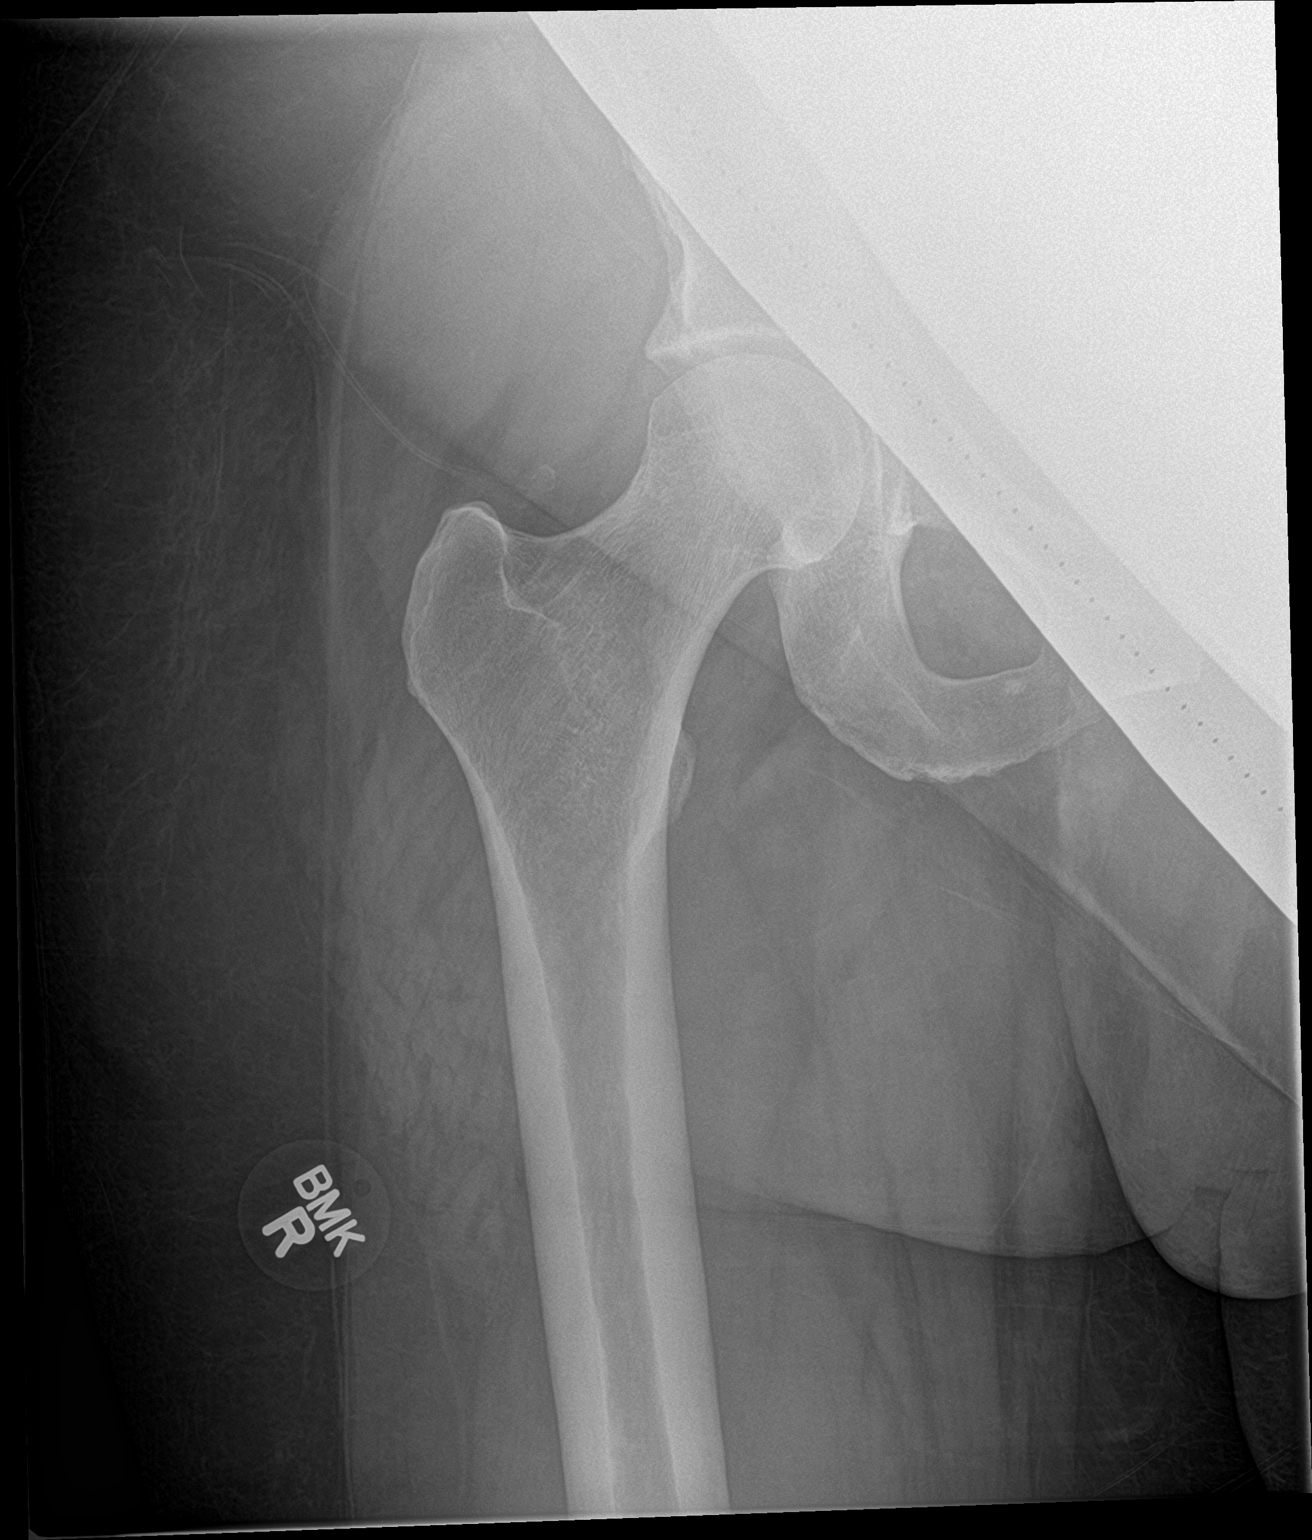

[hip lat]
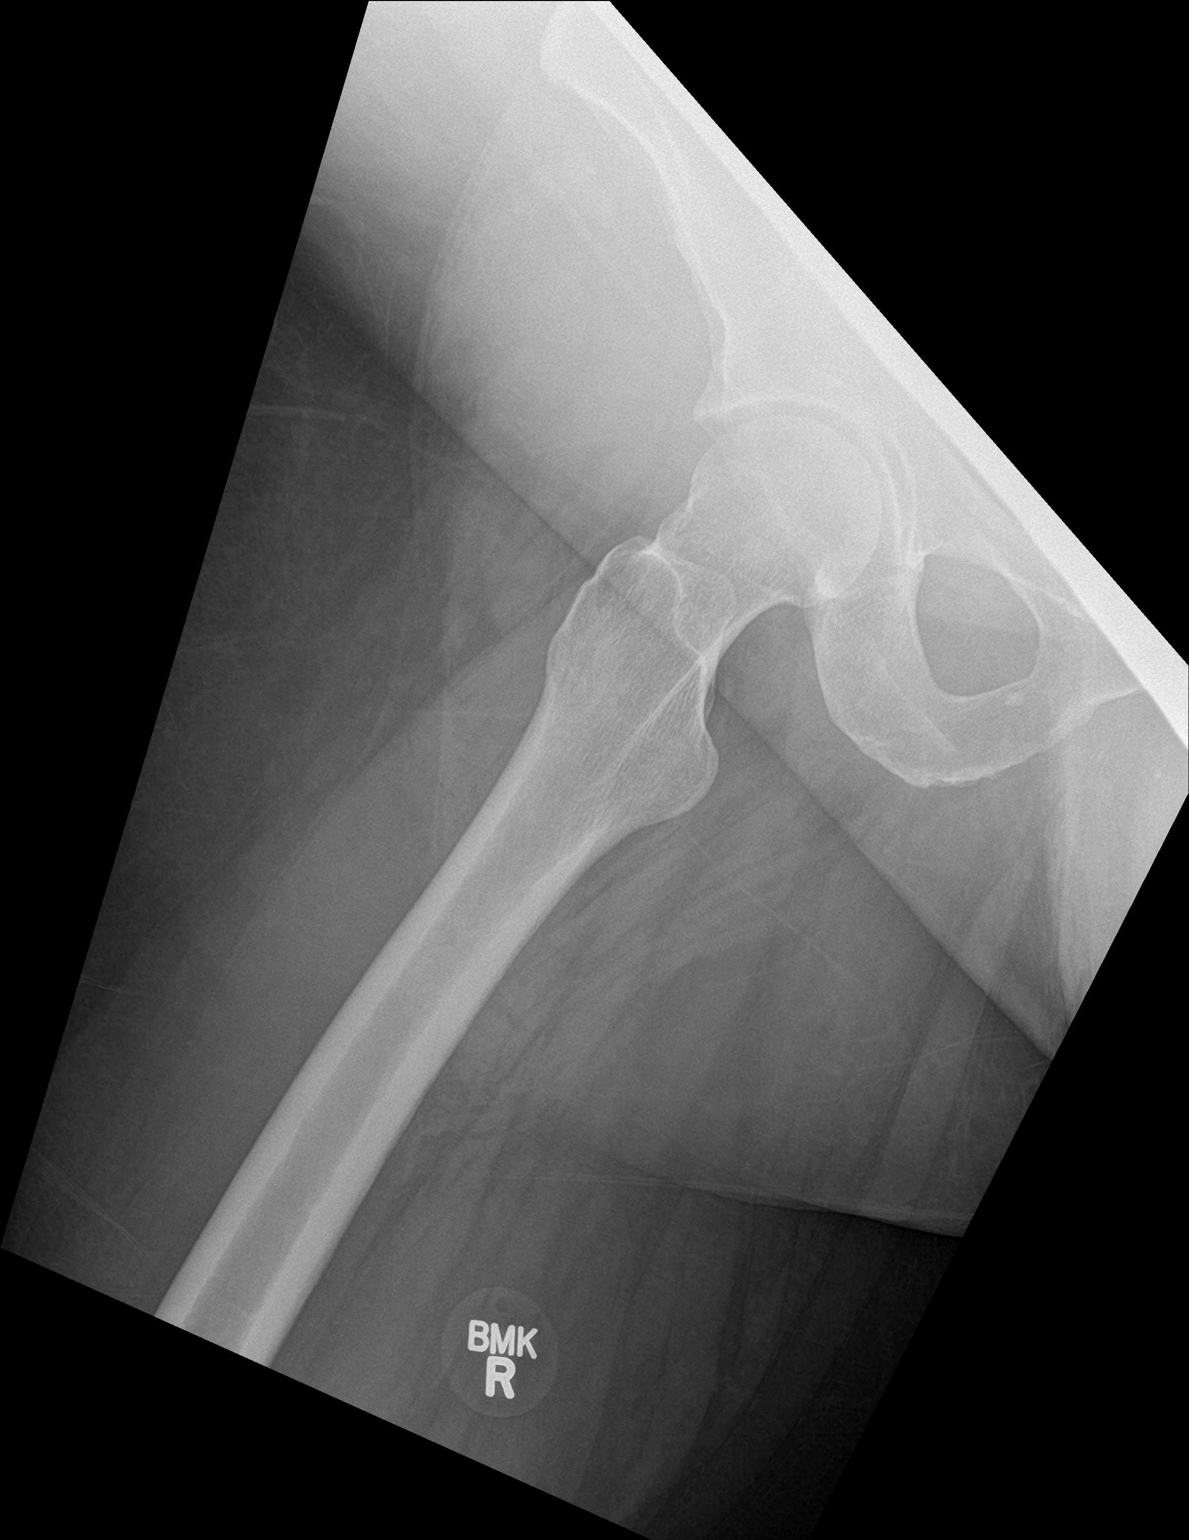

[2 of 2 positions shown; findings below may reference images not displayed]

FINDINGS: There is no evidence of hip fracture or dislocation. There is no
evidence of arthropathy or other focal bone abnormality.
IMPRESSION: Negative.

## 2018-07-06 ENCOUNTER — Other Ambulatory Visit: Payer: Self-pay | Admitting: Obstetrics and Gynecology

## 2018-07-06 DIAGNOSIS — Z369 Encounter for antenatal screening, unspecified: Secondary | ICD-10-CM

## 2018-07-09 LAB — OB RESULTS CONSOLE VARICELLA ZOSTER ANTIBODY, IGG: Varicella: IMMUNE

## 2018-07-09 LAB — OB RESULTS CONSOLE RUBELLA ANTIBODY, IGM: Rubella: IMMUNE

## 2018-07-16 ENCOUNTER — Ambulatory Visit
Admission: RE | Admit: 2018-07-16 | Discharge: 2018-07-16 | Disposition: A | Discharge status: Home / Self Care | Payer: BC Managed Care – PPO | Source: Ambulatory Visit | Attending: Obstetrics and Gynecology | Admitting: Obstetrics and Gynecology

## 2018-07-16 ENCOUNTER — Ambulatory Visit: Payer: BC Managed Care – PPO

## 2018-07-16 ENCOUNTER — Other Ambulatory Visit: Payer: Self-pay

## 2018-07-16 DIAGNOSIS — Z36 Encounter for antenatal screening for chromosomal anomalies: Secondary | ICD-10-CM | POA: Diagnosis not present

## 2018-07-16 DIAGNOSIS — Z818 Family history of other mental and behavioral disorders: Secondary | ICD-10-CM

## 2018-07-16 NOTE — Progress Notes (Signed)
Virtual Visit via Telephone Note  I connected with Melissa Carr on July 16, 2018 at  2:00 PM EDT by telephone and verified that I am speaking with the correct person using two identifiers.  [facility]  [facility] Length of consultation:  45 minutes  Melissa Carr  was [facility] for genetic counseling to review prenatal screening and testing options.  This note summarizes the information we discussed.    We offered the following routine screening tests for this pregnancy:  The most accurate screening option for chromosome conditions is cell free fetal DNA testing.  Though this is typically reserved for pregnancies at increased risk for aneuploidy, it is currently being made available and many insurance companies are adding coverage for this testing in low risk patients during COVID.  This test utilizes a maternal blood sample and DNA sequencing technology to isolate circulating cell free fetal DNA from maternal plasma.  The fetal DNA can then be analyzed for DNA sequences that are derived from the three most common chromosomes involved in aneuploidy, chromosomes 13, 18, and 21.  If the overall amount of DNA is greater than the expected level for any of these chromosomes, aneuploidy is suspected.  The detection rates are >99% for Down syndrome, >98% for trisomy 18 and >91% for Trisomy 13.  While we do not consider it a replacement for invasive testing and karyotype analysis, a negative result from this testing would be reassuring, though not a guarantee of a normal chromosome complement for the baby.  An abnormal result may be suggestive of an abnormal chromosome complement, though we would still recommend CVS or amniocentesis to confirm any findings from this testing.  First trimester screening, which includes nuchal translucency ultrasound screen and first trimester maternal serum marker screening, is the test that has most recently been  available for low risk patients.  The nuchal translucency has approximately an 80% detection rate for Down syndrome and can be positive for other chromosome abnormalities as well as congenital heart defects.  When combined with a maternal serum marker screening, the detection rate is up to 90% for Down syndrome and up to 97% for trisomy 18.   Given current recommendations during COVID, we are offering only the biochemical testing portion of this testing (without the ultrasound and NT portion), which has a much lower detection rate.  Maternal serum marker screening, or "quad" screen, is a blood test that measures pregnancy proteins, can provide risk assessments for Down syndrome, trisomy 18, and open neural tube defects (spina bifida, anencephaly). Because it does not directly examine the fetus, it cannot positively diagnose or rule out these problems. This is a second trimester option which could be offered along with the anatomy ultrasound. It can detect approximately 75% of babies with Down syndrome, 80% of babies with open spina bifida and 70% of babies with trisomy 8118.  Targeted ultrasound uses high frequency sound waves to create an image of the developing fetus.  An ultrasound is often recommended as a routine means of evaluating the pregnancy.  It is also used to screen for fetal anatomy problems (for example, a heart defect) that might be suggestive of a chromosomal or other abnormality. We are currently not recommending a first trimester ultrasound other than that which would be ordered for dating and viability.  Should these screening tests indicate an increased concern, then the following additional testing options would be offered:  The chorionic villus sampling procedure is available  for first trimester chromosome analysis.  This involves the withdrawal of a small amount of chorionic villi (tissue from the developing placenta).  Risk of pregnancy loss is estimated to be approximately 1 in 200 to  1 in 100 (0.5 to 1%).  There is approximately a 1% (1 in 100) chance that the CVS chromosome results will be unclear.  Chorionic villi cannot be tested for neural tube defects.     Amniocentesis involves the removal of a small amount of amniotic fluid from the sac surrounding the fetus with the use of a thin needle inserted through the maternal abdomen and uterus.  Ultrasound guidance is used throughout the procedure.  Fetal cells from amniotic fluid are directly evaluated and > 99.5% of chromosome problems and > 98% of open neural tube defects can be detected. This procedure is generally performed after the 15th week of pregnancy.  The main risks to this procedure include complications leading to miscarriage in less than 1 in 200 cases (0.5%).  Cystic Fibrosis and Spinal Muscular Atrophy (SMA) screening were also discussed with the patient. Both conditions are recessive, which means that both parents must be carriers in order to have a child with the disease.  Cystic fibrosis (CF) is one of the most common genetic conditions in persons of Caucasian ancestry.  This condition occurs in approximately 1 in 2,500 Caucasian persons and results in thickened secretions in the lungs, digestive, and reproductive systems.  For a baby to be at risk for having CF, both of the parents must be carriers for this condition.  Approximately 1 in 1725 Caucasian persons is a carrier for CF.  Current carrier testing looks for the most common mutations in the gene for CF and can detect approximately 90% of carriers in the Caucasian population.  This means that the carrier screening can greatly reduce, but cannot eliminate, the chance for an individual to have a child with CF.  If an individual is found to be a carrier for CF, then carrier testing would be available for the partner. As part of [place] newborn screening profile, all babies born in the state of [place] will have a two-tier screening process.  Specimens are  first tested to determine the concentration of immunoreactive trypsinogen (IRT).  The top 5% of specimens with the highest IRT values then undergo DNA testing using a panel of over 40 common CF mutations. SMA is a neurodegenerative disorder that leads to atrophy of skeletal muscle and overall weakness.  This condition is also more prevalent in the Caucasian population, with 1 in 40-1 in 60 persons being a carrier and 1 in 6,000-1 in 10,000 children being affected.  There are multiple forms of the disease, with some causing death in infancy to other forms with survival into adulthood.  The genetics of SMA is complex, but carrier screening can detect up to 95% of carriers in the Caucasian population.  Similar to CF, a negative result can greatly reduce, but cannot eliminate, the chance to have a child with SMA. The patient declined carrier screening for CF and SMA.  We talked about the option of signing up for Early Check to have the baby tested for SMA after delivery as part of a new study in [place].  This registration can be done online prior to delivery if desired. The patient is of mixed European ancestry including Jewish background.  Her partner is of mixed [place] ancestry.  We therefore also reviewed carrier screening for hemoglobinopathies  and Ashkenazi Jewish carrier screening.  We obtained a detailed family history and pregnancy history.  Melissa Carr reported a significant family history of mental health conditions in herself, siblings, aunts, uncles and cousins.  We discussed that there are likely complex interactions of genetic as well as situational or environmental factors involved in the development of mental health conditions.  At this time, genetic testing is not clnically available to determine who in a family is expected to develop such conditions, though we do expect first degree relatives of those who are affected to be at increased risk.  There is also a strong family history of  diabetes, types 1 and 2, which we reviewed is also expected to be multifactorial.  There are multiple family members with various types of cancer.  Specifically, Yure's sister had ovarian cancer in her 7520s and his grandmother had breast followed by colon cancer late in life.  Melissa Carr's paternal aunt had breast cancer in her 8440s and her maternal grandmother and a maternal cousin had leukemia. While the vast majority of cancers occur by chance, particularly when they occur in various unrelated family members at later ages, there are some families in which there may be a predisposition to cancer development.  This becomes especially concerning when there are multiple family members with the same type of cancer or when it is diagnosed at young ages.  If the patient becomes concerned about this history, we are happy to provide the contact information for a cancer genetic counselor.  There was also a family history of autism in Yure's sisters daughter.  We reviewed that autism may occur for various reasons including numerous genetic syndromes or as an isolated condition.  Melissa Carr did not know a lot of details about the medical evaluation for this family member.  If anything is learned about the cause for her condition, we are happy to speak further about this history.  Carrier screening for Fragile X could be considered, though this may be less of a concern for this pregnancy given the fact that Melissa Carr has no developmental concerns. Lastly, it was reported that Yure's maternal grandfather was born blind.  There may be many reasons for congenital blindness, including inherited as well as environmental factors.  Without more details, it is difficult to accurately assess the chance for recurrence in this pregnancy, however, given the lack of other affected family members in the two intervening generations, it is expected that there would be a low risk. The remainder of the family history was reported to be unremarkable for  birth defects, intellectual delays, recurrent pregnancy loss or known chromosome abnormalities.  This is the third pregnancy for Melissa Carr and her partner, Melissa Carr.  They have two healthy daughters, ages 678 and 1 years. The older daughter and Melissa Carr both have GI issues related to milk protein intolerance.  His 33 year old son has the same condition. In the current pregnancy, Melissa Carr reported no complications of exposure to tobacco, alcohol or recreational drugs.  She is taking a prenatal vitamin, Zoloft and a baby aspirin daily. Zoloft is not known to increase the risk for birth defect when taken in pregnancy.  Though some studies have suggested an increased risk for certain conditions, other studies have not confirmed these findings.  This and other similar medications have been associated with a mild transient neonatal syndrome, but have not been shown to adversely affect cognitive development.   After consideration of the options, Melissa Carr elected to have MaterniT21  PLUS with SCA drawn at a [facility].  This will minimize the number of visits that she needs to attend in person.  The patient declined carrier testing for hemoglobinopathies, Jewish carrier screening, CF and SMA.  The patient was encouraged to call with questions or concerns.  We can be contacted at [phone].  Labs ordered: MaterniT21 PLUS with SCA through [place], [place], CGC  I provided 45 minutes of non-face-to-face time during this encounter.   Donette Larry

## 2018-08-10 ENCOUNTER — Telehealth: Payer: Self-pay | Admitting: Obstetrics and Gynecology

## 2018-08-10 NOTE — Telephone Encounter (Signed)
The patient was informed of the results of her recent MaterniT21 testing which yielded NEGATIVE results.  The patient's specimen showed DNA consistent with two copies of chromosomes 21, 18 and 13.  The sensitivity for trisomy 21, trisomy 18 and trisomy 13 using this testing are reported as 99.1%, 99.9% and 91.7% respectively.  Thus, while the results of this testing are highly accurate, they are not considered diagnostic at this time.  Should more definitive information be desired, the patient may still consider amniocentesis.   As requested to know by the patient, sex chromosome analysis was included for this sample.  Results are consistent with a female fetus (Y chromosome material was detected). This is predicted with >99% accuracy.  A maternal serum AFP only should be considered if screening for neural tube defects is desired.  We may be reached at [phone] with any questions or concerns.   Melissa Acklin F. Bayleigh Loflin, MS, CGC   

## 2018-08-13 IMAGING — US US OB COMP LESS 14 WK
1 series · 13 of 28 positions shown · non-contrast
Comparison: None.

CLINICAL DATA: Cramping, left adnexal pain

EXAM:
OBSTETRIC <14 WK US AND TRANSVAGINAL OB US
TECHNIQUE: Both transabdominal and transvaginal ultrasound examinations were
performed for complete evaluation of the gestation as well as the
maternal uterus, adnexal regions, and pelvic cul-de-sac.
Transvaginal technique was performed to assess early pregnancy.

[Series 1: us ob comp less 14 wk · 0.16mm/px · 13 of 107 slices shown]
[im 4/107]
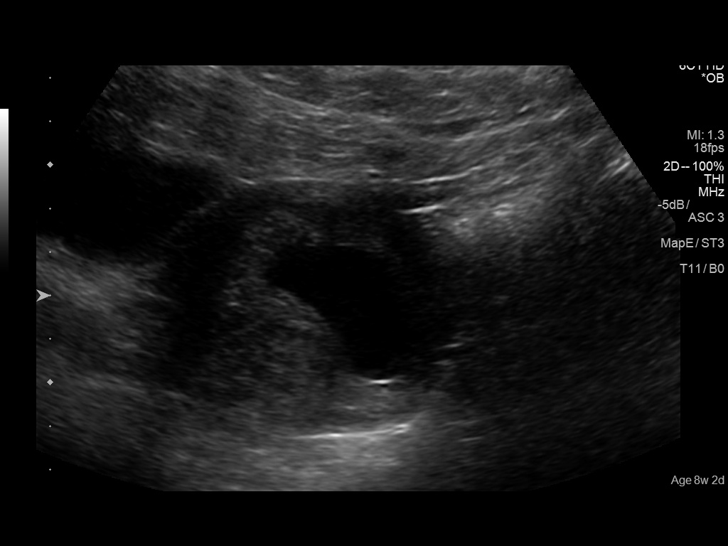
[im 12/107]
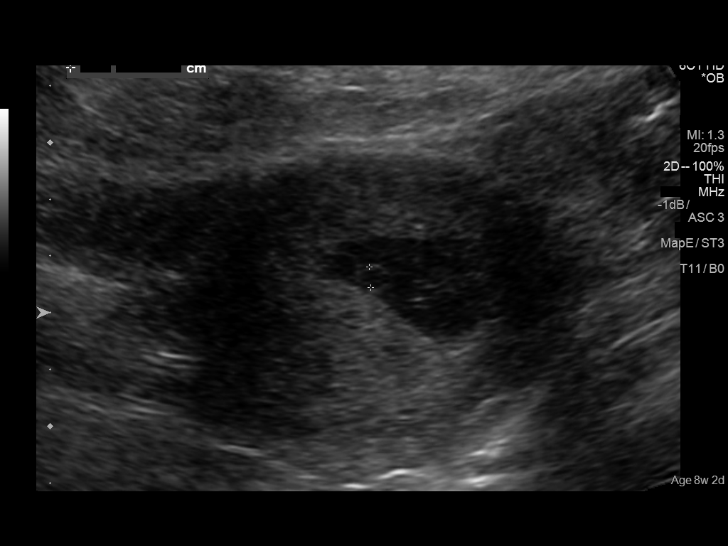
[im 20/107]
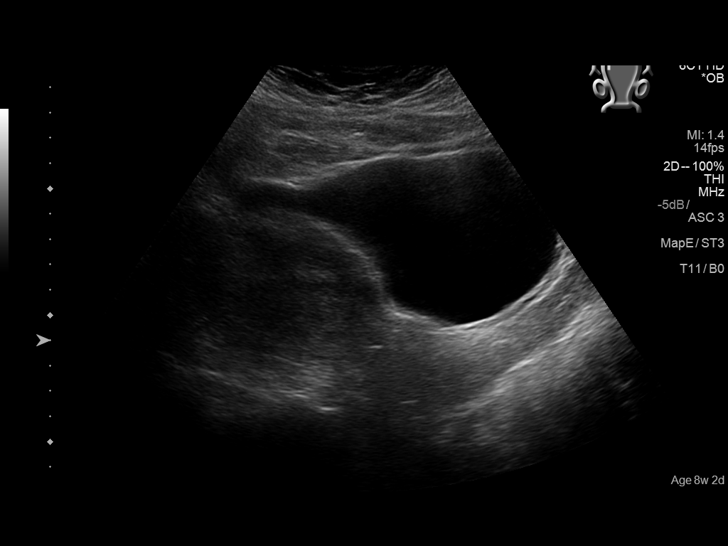
[im 28/107]
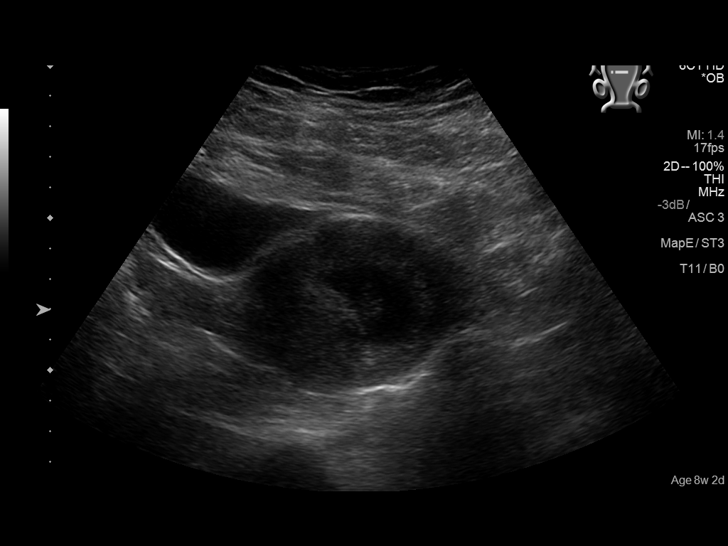
[im 36/107]
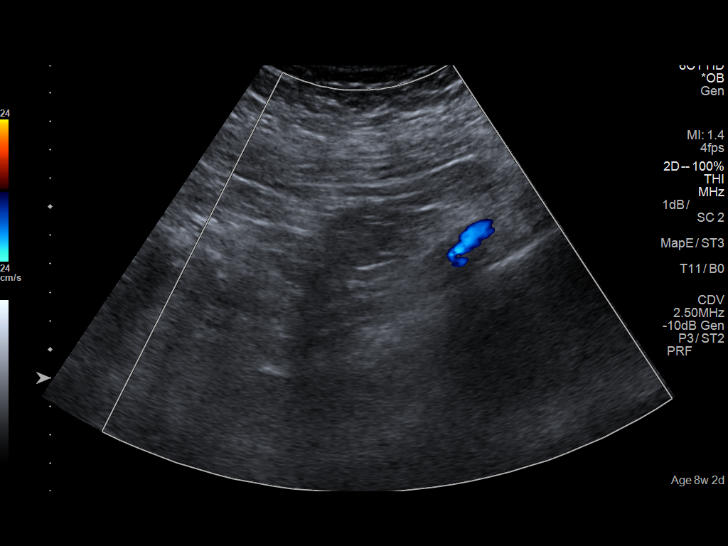
[im 44/107]
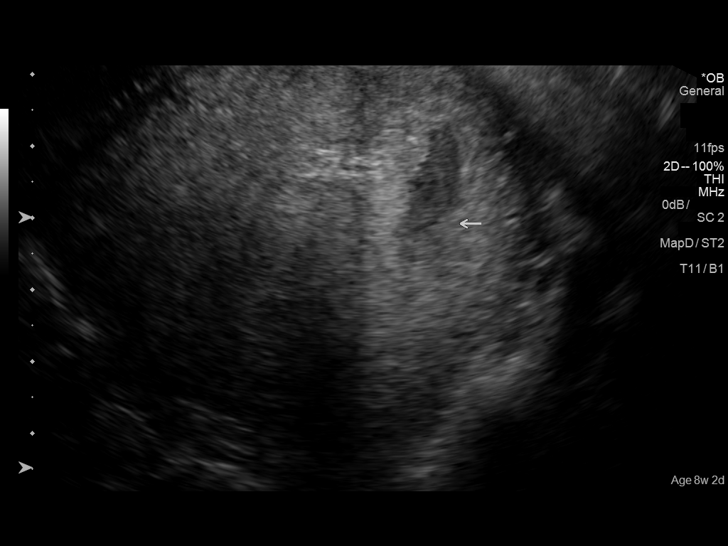
[im 55/107]
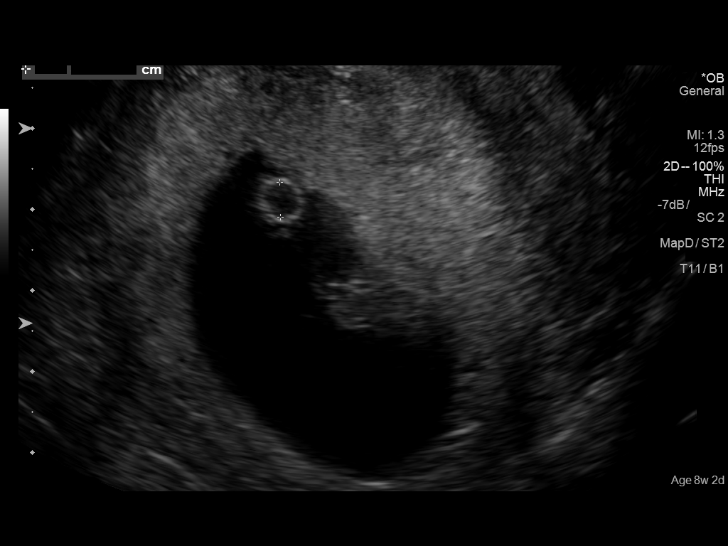
[im 63/107]
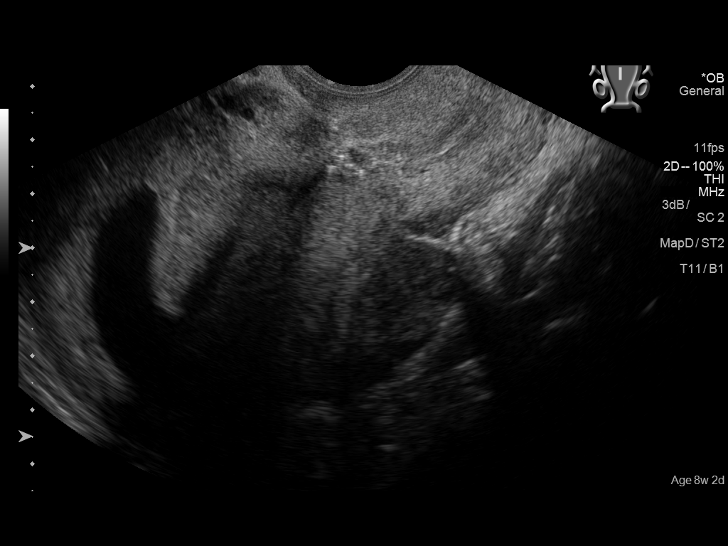
[im 71/107]
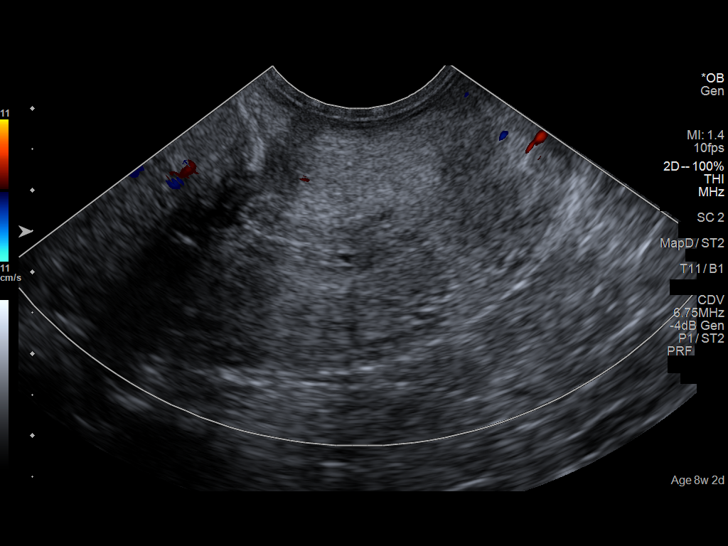
[im 79/107]
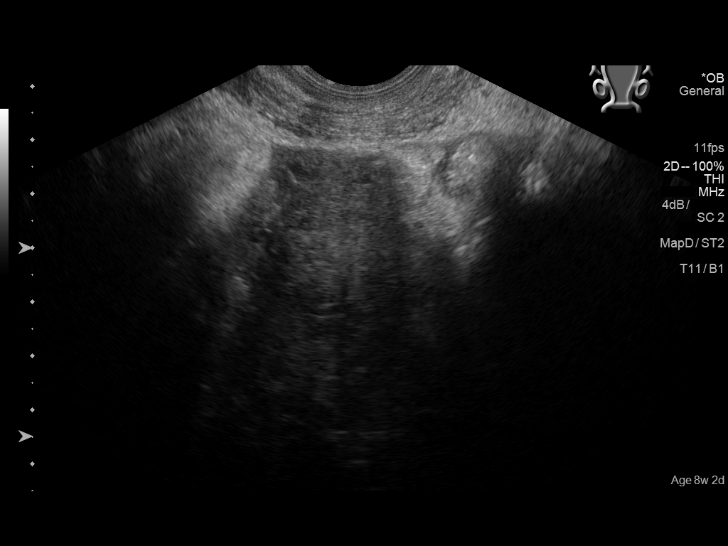
[im 87/107]
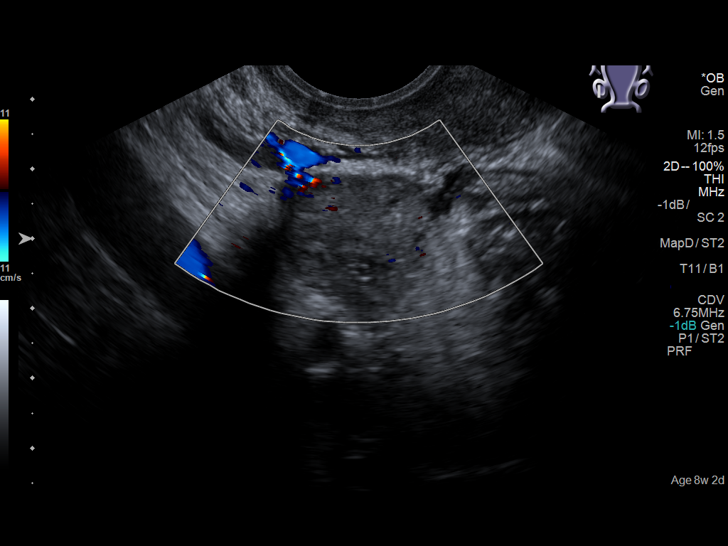
[im 95/107]
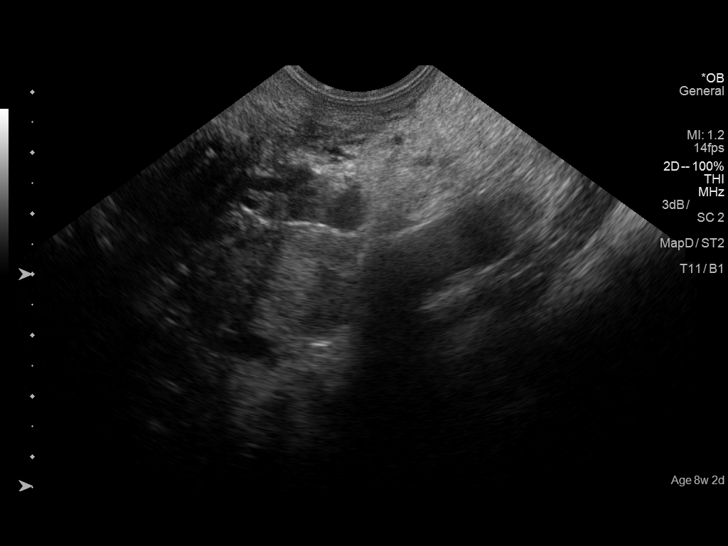
[im 103/107]
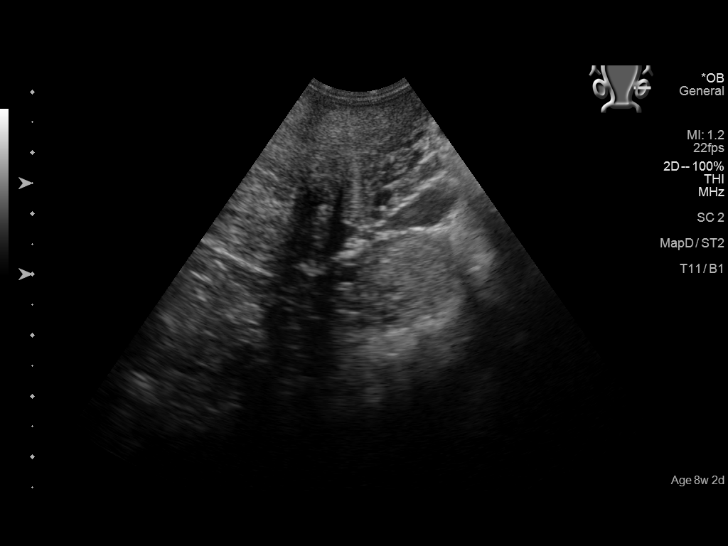

[13 of 28 positions shown; findings below may reference images not displayed]

FINDINGS: Intrauterine gestational sac: Single

Yolk sac:  Visualized.

Embryo:  Visualized.

Cardiac Activity: Visualized.

Heart Rate: 163  bpm

CRL:  20.2  mm   8 w   4 d                  US EDC: 11/02/2016

Subchorionic hemorrhage: Small subchorionic hemorrhage measuring
approximately 6 x 13 x 9 mm.

Maternal uterus/adnexae: Normal right ovary. 1.6 x 1.9 x 1.7 cm
hyperechoic left ovarian mass with peripheral Doppler flow which may
reflect an atypical corpus luteum cyst versus hemorrhagic cyst
versus endometrioma.
IMPRESSION: 1. Single live intrauterine pregnancy as described above.
2. Small subchorionic hemorrhage.
3. 1.6 x 1.9 x 1.7 cm hyperechoic left ovarian mass with peripheral
Doppler flow which may reflect an atypical corpus luteum cyst versus
hemorrhagic cyst versus endometrioma.

## 2018-10-29 LAB — OB RESULTS CONSOLE HEPATITIS B SURFACE ANTIGEN: Hepatitis B Surface Ag: NEGATIVE

## 2018-11-30 ENCOUNTER — Encounter: Payer: No Typology Code available for payment source | Attending: Certified Nurse Midwife | Admitting: *Deleted

## 2018-11-30 ENCOUNTER — Encounter: Payer: Self-pay | Admitting: *Deleted

## 2018-11-30 ENCOUNTER — Other Ambulatory Visit: Payer: Self-pay

## 2018-11-30 VITALS — BP 106/70 | Ht 64.0 in | Wt 254.1 lb

## 2018-11-30 DIAGNOSIS — Z713 Dietary counseling and surveillance: Secondary | ICD-10-CM | POA: Insufficient documentation

## 2018-11-30 DIAGNOSIS — O24419 Gestational diabetes mellitus in pregnancy, unspecified control: Secondary | ICD-10-CM | POA: Diagnosis not present

## 2018-11-30 DIAGNOSIS — Z3A00 Weeks of gestation of pregnancy not specified: Secondary | ICD-10-CM | POA: Insufficient documentation

## 2018-11-30 DIAGNOSIS — O24410 Gestational diabetes mellitus in pregnancy, diet controlled: Secondary | ICD-10-CM

## 2018-11-30 NOTE — Progress Notes (Signed)
Diabetes Self-Management Education  Visit Type: First/Initial  Appt. Start Time: 1315 Appt. End Time: 1425  11/30/2018  Ms. Melissa Carr, identified by name and date of birth, is a 33 y.o. female with a diagnosis of Diabetes: Gestational Diabetes.   ASSESSMENT  Blood pressure 106/70, height 5\' 4"  (1.626 m), weight 254 lb 1.6 oz (115.3 kg), last menstrual period 04/16/2018; estimated date of delivery 01/21/2019 Body mass index is 43.62 kg/m.  Diabetes Self-Management Education - 11/30/18 1504      Visit Information   Visit Type  First/Initial      Initial Visit   Diabetes Type  Gestational Diabetes    Are you currently following a meal plan?  Yes    What type of meal plan do you follow?  "trying to add healthier food choice to family meals, tried to stay away from eating out as much"    Are you taking your medications as prescribed?  Yes    Date Diagnosed  11/12/2018      Health Coping   How would you rate your overall health?  Fair      Psychosocial Assessment   Patient Belief/Attitude about Diabetes  Defeat/Burnout   "even worse - dont' like checking glucose levels, have anxiety, already have a lot on my plate - extra stress"   Self-care barriers  None    Self-management support  Doctor's office;Family    Patient Concerns  Nutrition/Meal planning;Glycemic Control;Monitoring;Weight Control;Other (comment)   "prevent diabetes in the future"   Special Needs  None    Preferred Learning Style  Hands on;Visual    Learning Readiness  Change in progress    How often do you need to have someone help you when you read instructions, pamphlets, or other written materials from your doctor or pharmacy?  1 - Never    What is the last grade level you completed in school?  2 year degree      Pre-Education Assessment   Patient understands the diabetes disease and treatment process.  Needs Review    Patient understands incorporating nutritional management into lifestyle.  Needs Review    Patient undertands incorporating physical activity into lifestyle.  Needs Review    Patient understands using medications safely.  Demonstrates understanding / competency    Patient understands monitoring blood glucose, interpreting and using results  Demonstrates understanding / competency    Patient understands prevention, detection, and treatment of acute complications.  Needs Review    Patient understands prevention, detection, and treatment of chronic complications.  Demonstrates understanding / competency    Patient understands how to develop strategies to address psychosocial issues.  Needs Review    Patient understands how to develop strategies to promote health/change behavior.  Needs Review      Complications   Last HgB A1C per patient/outside source  4.9 %   07/09/2018   How often do you check your blood sugar?  3-4 times/day    Fasting Blood glucose range (mg/dL)  [phone]   Pt reports FBG 78 mg/dL today   Postprandial Blood glucose range (mg/dL)  [phone]   Pt reports pp breafast 80 mg/dL, lunch 098102 mg/dL and supper 74 mg/dL.   Have you had a dilated eye exam in the past 12 months?  No    Have you had a dental exam in the past 12 months?  No    Are you checking your feet?  Yes    How many days per week are you checking your feet?  1      Dietary Intake   Breakfast  eggs, omelet, oatmeal, 1/2 bagel with cream cheese, bacon    Lunch  salad, tunna fish, left-overs, soup    Snack (afternoon)  jello, apple, banana    Dinner  chicken pork, occasional beef wtih potatoes, peas, beans, corn, occasional rice and pasta, green beans, broccoli, spinach and spring mix for salad, tomatoes, cukes, onions, sunflower seeds    Snack (evening)  apple sauce, nuts, jello    Beverage(s)  water, juice, milk      Exercise   Exercise Type  Light (walking / raking leaves)    How many days per week to you exercise?  1    How many minutes per day do you exercise?  30    Total minutes per week of exercise   30      Patient Education   Previous Diabetes Education  Yes (please comment)   Pt had GDM in 2018 and came to class.   Disease state   Factors that contribute to the development of diabetes;Explored patient's options for treatment of their diabetes    Nutrition management   Role of diet in the treatment of diabetes and the relationship between the three main macronutrients and blood glucose level;Food label reading, portion sizes and measuring food.;Reviewed blood glucose goals for pre and post meals and how to evaluate the patients' food intake on their blood glucose level.    Physical activity and exercise   Role of exercise on diabetes management, blood pressure control and cardiac health.    Medications  Other (comment)   Potential for insulin due to few oral medications to use during pregnancy. She was on insulin with her last pregnancy 2 years ago.   Monitoring  Taught/discussed recording of test results and interpretation of SMBG.;Identified appropriate SMBG and/or A1C goals.;Ketone testing, when, how.    Chronic complications  Relationship between chronic complications and blood glucose control    Psychosocial adjustment  Role of stress on diabetes;Identified and addressed patients feelings and concerns about diabetes    Preconception care  Pregnancy and GDM  Role of pre-pregnancy blood glucose control on the development of the fetus;Reviewed with patient blood glucose goals with pregnancy;Role of family planning for patients with diabetes      Individualized Goals (developed by patient)   Reducing Risk  Improve blood sugars Prevent diabetes complications Lose weight Become more fit Prevent diabetes in the future     Outcomes   Expected Outcomes  Demonstrated interest in learning. Expect positive outcomes    Future DMSE  PRN    Program Status  Not Completed       Individualized Plan for Diabetes Self-Management Training:   Learning Objective:  Patient will have a greater  understanding of diabetes self-management. Patient education plan is to attend individual and/or group sessions per assessed needs and concerns.   Plan:   Patient Instructions  Read booklet on Gestational Diabetes Follow Gestational Meal Planning Guidelines Avoid fruit juices Include 1 serving of protein when eating fruit as a snack Check blood sugars 4 x day - before breakfast and 2 hrs after every meal and record  Bring blood sugar log to all MD appointments Purchase urine ketone strips if ordered by MD and check urine ketones every am:  If + increase bedtime snack to 1 protein and 2 carbohydrate servings Walk 20-30 minutes at least 5 x week if permitted by MD  Expected Outcomes:  Demonstrated interest in learning.  Expect positive outcomes  Education material provided:  Gestational Booklet Gestational Meal Planning Guidelines Simple Meal Plan Goals for a Healthy Pregnancy  If problems or questions, patient to contact team via:  Johny Drilling, [place], [place], CDE [phone]  Future DSME appointment: PRN  Patient just came for Gestational Diabetes Class in 2018 and is able to recall much of the information taught. She doesn't want to return for a dietitian appointment at this time.

## 2018-11-30 NOTE — Patient Instructions (Signed)
Read booklet on Gestational Diabetes Follow Gestational Meal Planning Guidelines Avoid fruit juices Include 1 serving of protein when eating fruit as a snack Check blood sugars 4 x day - before breakfast and 2 hrs after every meal and record  Bring blood sugar log to all MD appointments Purchase urine ketone strips if ordered by MD and check urine ketones every am:  If + increase bedtime snack to 1 protein and 2 carbohydrate servings Walk 20-30 minutes at least 5 x week if permitted by MD

## 2018-12-07 ENCOUNTER — Encounter: Payer: BC Managed Care – PPO | Admitting: Physical Therapy

## 2019-01-04 LAB — OB RESULTS CONSOLE GC/CHLAMYDIA
Chlamydia: NEGATIVE
Gonorrhea: NEGATIVE

## 2019-01-04 LAB — OB RESULTS CONSOLE HIV ANTIBODY (ROUTINE TESTING): HIV: NONREACTIVE

## 2019-01-04 LAB — OB RESULTS CONSOLE RPR: RPR: NONREACTIVE

## 2019-01-04 LAB — OB RESULTS CONSOLE GBS: GBS: NEGATIVE

## 2019-01-05 NOTE — H&P (Signed)
See completed H/P

## 2019-01-12 ENCOUNTER — Encounter
Admission: RE | Admit: 2019-01-12 | Discharge: 2019-01-12 | Disposition: A | Discharge status: Home / Self Care | Payer: No Typology Code available for payment source | Source: Ambulatory Visit | Attending: Obstetrics and Gynecology | Admitting: Obstetrics and Gynecology

## 2019-01-12 ENCOUNTER — Encounter: Payer: Self-pay | Admitting: Obstetrics and Gynecology

## 2019-01-12 ENCOUNTER — Other Ambulatory Visit: Payer: Self-pay

## 2019-01-12 DIAGNOSIS — Z01812 Encounter for preprocedural laboratory examination: Secondary | ICD-10-CM | POA: Insufficient documentation

## 2019-01-12 DIAGNOSIS — Z20828 Contact with and (suspected) exposure to other viral communicable diseases: Secondary | ICD-10-CM | POA: Insufficient documentation

## 2019-01-12 LAB — TYPE AND SCREEN
ABO/RH(D): B POS
Antibody Screen: NEGATIVE
Extend sample reason: UNDETERMINED

## 2019-01-12 LAB — BASIC METABOLIC PANEL
Anion gap: 9 (ref 5–15)
BUN: 6 mg/dL (ref 6–20)
CO2: 21 mmol/L — ABNORMAL LOW (ref 22–32)
Calcium: 8.5 mg/dL — ABNORMAL LOW (ref 8.9–10.3)
Chloride: 105 mmol/L (ref 98–111)
Creatinine, Ser: 0.43 mg/dL — ABNORMAL LOW (ref 0.44–1.00)
GFR calc Af Amer: >60 mL/min (ref >60)
GFR calc non Af Amer: >60 mL/min (ref >60)
Glucose, Bld: 128 mg/dL — ABNORMAL HIGH (ref 70–99)
Potassium: 3.8 mmol/L (ref 3.5–5.1)
Sodium: 135 mmol/L (ref 135–145)

## 2019-01-12 LAB — CBC
HCT: 33.3 % — ABNORMAL LOW (ref 36.0–46.0)
Hemoglobin: 11.9 g/dL — ABNORMAL LOW (ref 12.0–15.0)
MCH: 29.7 pg (ref 26.0–34.0)
MCHC: 35.7 g/dL (ref 30.0–36.0)
MCV: 83 fL (ref 80.0–100.0)
Platelets: 168 10*3/uL (ref 150–400)
RBC: 4.01 MIL/uL (ref 3.87–5.11)
RDW: 13.7 % (ref 11.5–15.5)
WBC: 8.7 10*3/uL (ref 4.0–10.5)
nRBC: 0 % (ref 0.0–0.2)

## 2019-01-12 LAB — SARS CORONAVIRUS 2 (TAT 6-24 HRS): SARS Coronavirus 2: NEGATIVE

## 2019-01-12 LAB — RAPID HIV SCREEN (HIV 1/2 AB+AG)
HIV 1/2 Antibodies: NONREACTIVE
HIV-1 P24 Antigen - HIV24: NONREACTIVE

## 2019-01-12 NOTE — Patient Instructions (Addendum)
Your procedure is scheduled on: 01/15/2019 Fri At 5:30am Report to medical mall Call [phone] upon arrival and someone will come and escort you to L&D.   Remember: Instructions that are not followed completely may result in serious medical risk, up to and including death, or upon the discretion of your surgeon and anesthesiologist your surgery may need to be rescheduled.    _x___ 1. Do not eat food after midnight the night before your procedure. You may drink clear liquids up to 2 hours before you are scheduled to arrive at the hospital for your procedure.  Do not drink clear liquids within 2 hours of your scheduled arrival to the hospital.  Clear liquids include  --Water or Apple juice without pulp  --Clear carbohydrate beverage such as ClearFast or Gatorade  --Black Coffee or Clear Tea (No milk, no creamers, do not add anything to                  the coffee or Tea Type 1 and type 2 diabetics should only drink water.   ____Ensure clear carbohydrate drink on the way to the hospital for bariatric patients  __x__Ensure clear carbohydrate drink 3 hours before surgery. Complete the drink 1 hour before coming to the hospital.   No gum chewing or hard candies.     __x__ 2. No Alcohol for 24 hours before or after surgery.   __x__3. No Smoking or e-cigarettes for 24 prior to surgery.  Do not use any chewable tobacco products for at least 6 hour prior to surgery   ____  4. Bring all medications with you on the day of surgery if instructed.    __x__ 5. Notify your doctor if there is any change in your medical condition     (cold, fever, infections).    x___6. On the morning of surgery brush your teeth with toothpaste and water.  You may rinse your mouth with mouth wash if you wish.  Do not swallow any toothpaste or mouthwash.   Do not wear jewelry, make-up, hairpins, clips or nail polish.  Do not wear lotions, powders, or perfumes. You may wear deodorant.  Do not shave 48 hours prior to  surgery. Men may shave face and neck.  Do not bring valuables to the hospital.    [facility] is not responsible for any belongings or valuables.               Contacts, dentures or bridgework may not be worn into surgery.  Leave your suitcase in the car. After surgery it may be brought to your room.  For patients admitted to the hospital, discharge time is determined by your                       treatment team.  _  Patients discharged the day of surgery will not be allowed to drive home.  You will need someone to drive you home and stay with you the night of your procedure.    Please read over the following fact sheets that you were given:   [facility] Preparing for Surgery and or MRSA Information   _x___ Take anti-hypertensive listed below, cardiac, seizure, asthma,     anti-reflux and psychiatric medicines. These include:  1. none  2.  3.  4.  5.  6.  ____Fleets enema or Magnesium Citrate as directed.   _x___ Use CHG Soap or sage wipes as directed on instruction sheet   ____ Use inhalers  on the day of surgery and bring to hospital day of surgery  ____ Stop Metformin and Janumet 2 days prior to surgery.    ____ Take 1/2 of usual insulin dose the night before surgery and none on the morning     surgery.   _x___ Follow recommendations from Cardiologist, Pulmonologist or PCP regarding          stopping Aspirin, Coumadin, Plavix ,Eliquis, Effient, or Pradaxa, and Pletal.  X____Stop Anti-inflammatories such as Advil, Aleve, Ibuprofen, Motrin, Naproxen, Naprosyn, Goodies powders or aspirin products. OK to take Tylenol and                          Celebrex.   _x___ Stop supplements until after surgery.  But may continue Vitamin D, Vitamin B,       and multivitamin.   ____ Bring C-Pap to the hospital.

## 2019-01-12 NOTE — Patient Instructions (Addendum)
Your procedure is scheduled on: 01/15/2019 Fri Report to Medical mall at 5:30 am call L&D at [phone] someone will come to the medical malll and escort you to labor and delivery. Remember: Instructions that are not followed completely may result in serious medical risk, up to and including death, or upon the discretion of your surgeon and anesthesiologist your surgery may need to be rescheduled.    _x___ 1. Do not eat food after midnight the night before your procedure. You may drink clear liquids up to 2 hours before you are scheduled to arrive at the hospital for your procedure.  Do not drink clear liquids within 2 hours of your scheduled arrival to the hospital.  Clear liquids include  --Water or Apple juice without pulp  --Clear carbohydrate beverage such as ClearFast or Gatorade  --Black Coffee or Clear Tea (No milk, no creamers, do not add anything to                  the coffee or Tea Type 1 and type 2 diabetics should only drink water.   ____Ensure clear carbohydrate drink on the way to the hospital for bariatric patients  _x___Ensure clear carbohydrate drink 3 hours before surgery. Complete the drink 1 hour prior to coming to the hospital.   No gum chewing or hard candies.     __x__ 2. No Alcohol for 24 hours before or after surgery.   __x__3. No Smoking or e-cigarettes for 24 prior to surgery.  Do not use any chewable tobacco products for at least 6 hour prior to surgery   ____  4. Bring all medications with you on the day of surgery if instructed.    __x__ 5. Notify your doctor if there is any change in your medical condition     (cold, fever, infections).    x___6. On the morning of surgery brush your teeth with toothpaste and water.  You may rinse your mouth with mouth wash if you wish.  Do not swallow any toothpaste or mouthwash.   Do not wear jewelry, make-up, hairpins, clips or nail polish.  Do not wear lotions, powders, or perfumes. You may wear deodorant.  Do  not shave 48 hours prior to surgery. Men may shave face and neck.  Do not bring valuables to the hospital.    [facility] is not responsible for any belongings or valuables.               Contacts, dentures or bridgework may not be worn into surgery.  Leave your suitcase in the car. After surgery it may be brought to your room.  For patients admitted to the hospital, discharge time is determined by your                       treatment team.  _  Patients discharged the day of surgery will not be allowed to drive home.  You will need someone to drive you home and stay with you the night of your procedure.    Please read over the following fact sheets that you were given:   [facility] Preparing for Surgery and or MRSA Information   _x___ Take anti-hypertensive listed below, cardiac, seizure, asthma,     anti-reflux and psychiatric medicines. These include:  1. none  2.  3.  4.  5.  6.  ____Fleets enema or Magnesium Citrate as directed.   _x___ Use CHG Soap or sage wipes as directed on instruction  sheet   ____ Use inhalers on the day of surgery and bring to hospital day of surgery  ____ Stop Metformin and Janumet 2 days prior to surgery.    ____ Take 1/2 of usual insulin dose the night before surgery and none on the morning     surgery.   _x___ Follow recommendations from Cardiologist, Pulmonologist or PCP regarding          stopping Aspirin, Coumadin, Plavix ,Eliquis, Effient, or Pradaxa, and Pletal.  X____Stop Anti-inflammatories such as Advil, Aleve, Ibuprofen, Motrin, Naproxen, Naprosyn, Goodies powders or aspirin products. OK to take Tylenol and                          Celebrex.   _x___ Stop supplements until after surgery.  But may continue Vitamin D, Vitamin B,       and multivitamin.   ____ Bring C-Pap to the hospital.

## 2019-01-12 NOTE — Pre-Procedure Instructions (Signed)
Called twice left message.  Incentive spirometry, carbohydrate drink, and sage wipes placed in bag for pick up at time of COVID testing.

## 2019-01-13 LAB — RPR: RPR Ser Ql: NONREACTIVE

## 2019-01-14 ENCOUNTER — Inpatient Hospital Stay
Admission: EM | Admit: 2019-01-14 | Discharge: 2019-01-16 | DRG: 785 | Disposition: A | Discharge status: Home / Self Care | Payer: No Typology Code available for payment source | Attending: Obstetrics and Gynecology | Admitting: Obstetrics and Gynecology

## 2019-01-14 ENCOUNTER — Inpatient Hospital Stay: Payer: No Typology Code available for payment source | Admitting: Registered Nurse

## 2019-01-14 ENCOUNTER — Inpatient Hospital Stay
Admission: RE | Admit: 2019-01-14 | Payer: Non-veteran care | Source: Home / Self Care | Admitting: Obstetrics and Gynecology

## 2019-01-14 ENCOUNTER — Other Ambulatory Visit: Payer: Self-pay

## 2019-01-14 ENCOUNTER — Encounter
Admission: EM | Disposition: A | Discharge status: Home / Self Care | Payer: Self-pay | Source: Home / Self Care | Attending: Obstetrics and Gynecology

## 2019-01-14 DIAGNOSIS — Z20828 Contact with and (suspected) exposure to other viral communicable diseases: Secondary | ICD-10-CM | POA: Diagnosis present

## 2019-01-14 DIAGNOSIS — O34211 Maternal care for low transverse scar from previous cesarean delivery: Principal | ICD-10-CM | POA: Diagnosis present

## 2019-01-14 DIAGNOSIS — O99344 Other mental disorders complicating childbirth: Secondary | ICD-10-CM | POA: Diagnosis present

## 2019-01-14 DIAGNOSIS — O24420 Gestational diabetes mellitus in childbirth, diet controlled: Secondary | ICD-10-CM | POA: Diagnosis present

## 2019-01-14 DIAGNOSIS — Z3A39 39 weeks gestation of pregnancy: Secondary | ICD-10-CM | POA: Diagnosis not present

## 2019-01-14 DIAGNOSIS — Z87891 Personal history of nicotine dependence: Secondary | ICD-10-CM | POA: Diagnosis not present

## 2019-01-14 DIAGNOSIS — Z302 Encounter for sterilization: Secondary | ICD-10-CM

## 2019-01-14 DIAGNOSIS — O0993 Supervision of high risk pregnancy, unspecified, third trimester: Secondary | ICD-10-CM

## 2019-01-14 DIAGNOSIS — F329 Major depressive disorder, single episode, unspecified: Secondary | ICD-10-CM | POA: Diagnosis present

## 2019-01-14 HISTORY — DX: Cardiac murmur, unspecified: R01.1

## 2019-01-14 HISTORY — DX: Abnormal levels of other serum enzymes: R74.8

## 2019-01-14 HISTORY — DX: Bronchitis, not specified as acute or chronic: J40

## 2019-01-14 LAB — GLUCOSE, CAPILLARY: Glucose-Capillary: 103 mg/dL — ABNORMAL HIGH (ref 70–99)

## 2019-01-14 SURGERY — Surgical Case
Anesthesia: Spinal | Laterality: Bilateral

## 2019-01-14 MED ORDER — TRAZODONE HCL 50 MG PO TABS
25.0000 mg | ORAL_TABLET | Freq: Every day | ORAL | Status: DC
  Administered 2019-01-14 – 2019-01-15 (×2): 25 mg via ORAL
  Filled 2019-01-14 (×3): qty 0.5

## 2019-01-14 MED ORDER — SIMETHICONE 80 MG PO CHEW
80.0000 mg | CHEWABLE_TABLET | ORAL | Status: DC
  Administered 2019-01-15 – 2019-01-16 (×2): 80 mg via ORAL
  Filled 2019-01-14 (×2): qty 1

## 2019-01-14 MED ORDER — SODIUM CHLORIDE 0.9 % IV SOLN
INTRAVENOUS | Status: DC | PRN
  Administered 2019-01-14: 50 ug/min via INTRAVENOUS

## 2019-01-14 MED ORDER — ACETAMINOPHEN 500 MG PO TABS: 1000.0000 mg | ORAL_TABLET | Freq: Four times a day (QID) | ORAL | Status: DC

## 2019-01-14 MED ORDER — DIPHENHYDRAMINE HCL 25 MG PO CAPS: 25.0000 mg | ORAL_CAPSULE | ORAL | Status: DC | PRN

## 2019-01-14 MED ORDER — NALOXONE HCL 0.4 MG/ML IJ SOLN: 0.4000 mg | INTRAMUSCULAR | Status: DC | PRN

## 2019-01-14 MED ORDER — NALOXONE HCL 4 MG/10ML IJ SOLN
1.0000 ug/kg/h | INTRAVENOUS | Status: DC | PRN
  Filled 2019-01-14: qty 5

## 2019-01-14 MED ORDER — DIPHENHYDRAMINE HCL 50 MG/ML IJ SOLN
12.5000 mg | INTRAMUSCULAR | Status: DC | PRN
  Administered 2019-01-14: 12.5 mg via INTRAVENOUS

## 2019-01-14 MED ORDER — NALBUPHINE HCL 10 MG/ML IJ SOLN: 5.0000 mg | Freq: Once | INTRAMUSCULAR | Status: DC | PRN

## 2019-01-14 MED ORDER — BUPIVACAINE LIPOSOME 1.3 % IJ SUSP
INTRAMUSCULAR | Status: AC
  Filled 2019-01-14: qty 20

## 2019-01-14 MED ORDER — ACETAMINOPHEN 500 MG PO TABS: 1000.0000 mg | ORAL_TABLET | ORAL | Status: DC

## 2019-01-14 MED ORDER — CEFAZOLIN SODIUM-DEXTROSE 2-4 GM/100ML-% IV SOLN
2.0000 g | INTRAVENOUS | Status: AC
  Administered 2019-01-14: 2 g via INTRAVENOUS
  Filled 2019-01-14: qty 100

## 2019-01-14 MED ORDER — SODIUM CHLORIDE 0.9% FLUSH: 3.0000 mL | INTRAVENOUS | Status: DC | PRN

## 2019-01-14 MED ORDER — WITCH HAZEL-GLYCERIN EX PADS: 1.0000 "application " | MEDICATED_PAD | CUTANEOUS | Status: DC | PRN

## 2019-01-14 MED ORDER — NALBUPHINE HCL 10 MG/ML IJ SOLN: 5.0000 mg | INTRAMUSCULAR | Status: DC | PRN

## 2019-01-14 MED ORDER — ONDANSETRON HCL 4 MG/2ML IJ SOLN: 4.0000 mg | Freq: Three times a day (TID) | INTRAMUSCULAR | Status: DC | PRN

## 2019-01-14 MED ORDER — BUPIVACAINE HCL (PF) 0.5 % IJ SOLN
INTRAMUSCULAR | Status: AC
  Filled 2019-01-14: qty 30

## 2019-01-14 MED ORDER — KETOROLAC TROMETHAMINE 30 MG/ML IJ SOLN
30.0000 mg | Freq: Four times a day (QID) | INTRAMUSCULAR | Status: AC | PRN
  Administered 2019-01-14: 30 mg via INTRAVENOUS

## 2019-01-14 MED ORDER — ACETAMINOPHEN 500 MG PO TABS
1000.0000 mg | ORAL_TABLET | Freq: Four times a day (QID) | ORAL | Status: DC
  Administered 2019-01-15 – 2019-01-16 (×4): 1000 mg via ORAL
  Filled 2019-01-14 (×5): qty 2

## 2019-01-14 MED ORDER — DIPHENHYDRAMINE HCL 50 MG/ML IJ SOLN
INTRAMUSCULAR | Status: AC
  Filled 2019-01-14: qty 1

## 2019-01-14 MED ORDER — ONDANSETRON HCL 4 MG/2ML IJ SOLN: 4.0000 mg | Freq: Once | INTRAMUSCULAR | Status: DC | PRN

## 2019-01-14 MED ORDER — NALBUPHINE HCL 10 MG/ML IJ SOLN
5.0000 mg | INTRAMUSCULAR | Status: DC | PRN
  Administered 2019-01-14 (×2): 5 mg via INTRAVENOUS
  Filled 2019-01-14 (×2): qty 1

## 2019-01-14 MED ORDER — LACTATED RINGERS IV SOLN: INTRAVENOUS | Status: DC

## 2019-01-14 MED ORDER — ACETAMINOPHEN 500 MG PO TABS
1000.0000 mg | ORAL_TABLET | Freq: Four times a day (QID) | ORAL | Status: AC
  Administered 2019-01-14 – 2019-01-15 (×4): 1000 mg via ORAL
  Filled 2019-01-14 (×4): qty 2

## 2019-01-14 MED ORDER — FENTANYL CITRATE (PF) 100 MCG/2ML IJ SOLN
INTRAMUSCULAR | Status: DC | PRN
  Administered 2019-01-14: 15 ug via INTRAVENOUS

## 2019-01-14 MED ORDER — FLUTICASONE PROPIONATE 50 MCG/ACT NA SUSP
2.0000 | Freq: Every day | NASAL | Status: DC | PRN
  Filled 2019-01-14: qty 16

## 2019-01-14 MED ORDER — METHYLERGONOVINE MALEATE 0.2 MG/ML IJ SOLN
INTRAMUSCULAR | Status: AC
  Filled 2019-01-14: qty 1

## 2019-01-14 MED ORDER — OXYTOCIN 40 UNITS IN NORMAL SALINE INFUSION - SIMPLE MED
INTRAVENOUS | Status: AC
  Filled 2019-01-14: qty 1000

## 2019-01-14 MED ORDER — MEASLES, MUMPS & RUBELLA VAC IJ SOLR
0.5000 mL | Freq: Once | INTRAMUSCULAR | Status: DC
  Filled 2019-01-14: qty 0.5

## 2019-01-14 MED ORDER — DIBUCAINE (PERIANAL) 1 % EX OINT: 1.0000 "application " | TOPICAL_OINTMENT | CUTANEOUS | Status: DC | PRN

## 2019-01-14 MED ORDER — BUPIVACAINE IN DEXTROSE 0.75-8.25 % IT SOLN
INTRATHECAL | Status: DC | PRN
  Administered 2019-01-14: 1.6 mL via INTRATHECAL

## 2019-01-14 MED ORDER — FLEET ENEMA 7-19 GM/118ML RE ENEM: 1.0000 | ENEMA | Freq: Every day | RECTAL | Status: DC | PRN

## 2019-01-14 MED ORDER — BUPIVACAINE HCL (PF) 0.5 % IJ SOLN
INTRAMUSCULAR | Status: DC | PRN
  Administered 2019-01-14: 30 mL

## 2019-01-14 MED ORDER — PHENYLEPHRINE HCL (PRESSORS) 10 MG/ML IV SOLN
INTRAVENOUS | Status: DC | PRN
  Administered 2019-01-14 (×2): 100 ug via INTRAVENOUS

## 2019-01-14 MED ORDER — FERROUS SULFATE 325 (65 FE) MG PO TABS
325.0000 mg | ORAL_TABLET | Freq: Two times a day (BID) | ORAL | Status: DC
  Administered 2019-01-14 – 2019-01-16 (×4): 325 mg via ORAL
  Filled 2019-01-14 (×4): qty 1

## 2019-01-14 MED ORDER — CYCLOBENZAPRINE HCL 10 MG PO TABS
10.0000 mg | ORAL_TABLET | Freq: Three times a day (TID) | ORAL | Status: DC | PRN
  Filled 2019-01-14: qty 1

## 2019-01-14 MED ORDER — DIPHENHYDRAMINE HCL 25 MG PO CAPS: 25.0000 mg | ORAL_CAPSULE | Freq: Four times a day (QID) | ORAL | Status: DC | PRN

## 2019-01-14 MED ORDER — SENNOSIDES-DOCUSATE SODIUM 8.6-50 MG PO TABS
2.0000 | ORAL_TABLET | ORAL | Status: DC
  Administered 2019-01-15 – 2019-01-16 (×2): 2 via ORAL
  Filled 2019-01-14 (×2): qty 2

## 2019-01-14 MED ORDER — TETANUS-DIPHTH-ACELL PERTUSSIS 5-2.5-18.5 LF-MCG/0.5 IM SUSP
0.5000 mL | Freq: Once | INTRAMUSCULAR | Status: DC
  Filled 2019-01-14: qty 0.5

## 2019-01-14 MED ORDER — FENTANYL CITRATE (PF) 100 MCG/2ML IJ SOLN: 25.0000 ug | INTRAMUSCULAR | Status: DC | PRN

## 2019-01-14 MED ORDER — MEPERIDINE HCL 25 MG/ML IJ SOLN: 6.2500 mg | INTRAMUSCULAR | Status: DC | PRN

## 2019-01-14 MED ORDER — ONDANSETRON HCL 4 MG/2ML IJ SOLN
INTRAMUSCULAR | Status: DC | PRN
  Administered 2019-01-14: 4 mg via INTRAVENOUS

## 2019-01-14 MED ORDER — OXYTOCIN 40 UNITS IN NORMAL SALINE INFUSION - SIMPLE MED
INTRAVENOUS | Status: DC | PRN
  Administered 2019-01-14: 500 mL via INTRAVENOUS

## 2019-01-14 MED ORDER — IBUPROFEN 800 MG PO TABS
800.0000 mg | ORAL_TABLET | Freq: Four times a day (QID) | ORAL | Status: DC
  Administered 2019-01-15 – 2019-01-16 (×4): 800 mg via ORAL
  Filled 2019-01-14 (×4): qty 1

## 2019-01-14 MED ORDER — SIMETHICONE 80 MG PO CHEW: 80.0000 mg | CHEWABLE_TABLET | ORAL | Status: DC | PRN

## 2019-01-14 MED ORDER — COCONUT OIL OIL: 1.0000 "application " | TOPICAL_OIL | Status: DC | PRN

## 2019-01-14 MED ORDER — PRENATAL MULTIVITAMIN CH
1.0000 | ORAL_TABLET | Freq: Every day | ORAL | Status: DC
  Administered 2019-01-15 – 2019-01-16 (×2): 1 via ORAL
  Filled 2019-01-14 (×2): qty 1

## 2019-01-14 MED ORDER — ACETAMINOPHEN 10 MG/ML IV SOLN
INTRAVENOUS | Status: DC | PRN
  Administered 2019-01-14: 1000 mg via INTRAVENOUS

## 2019-01-14 MED ORDER — OXYCODONE HCL 5 MG PO TABS
5.0000 mg | ORAL_TABLET | ORAL | Status: DC | PRN
  Administered 2019-01-15: 5 mg via ORAL
  Administered 2019-01-15 (×3): 10 mg via ORAL
  Administered 2019-01-15 – 2019-01-16 (×2): 5 mg via ORAL
  Administered 2019-01-16 (×2): 10 mg via ORAL
  Filled 2019-01-14: qty 1
  Filled 2019-01-14 (×2): qty 2
  Filled 2019-01-14: qty 1
  Filled 2019-01-14 (×4): qty 2

## 2019-01-14 MED ORDER — SOD CITRATE-CITRIC ACID 500-334 MG/5ML PO SOLN
30.0000 mL | ORAL | Status: AC
  Administered 2019-01-14: 30 mL via ORAL
  Filled 2019-01-14: qty 30

## 2019-01-14 MED ORDER — KETOROLAC TROMETHAMINE 30 MG/ML IJ SOLN: 30.0000 mg | Freq: Four times a day (QID) | INTRAMUSCULAR | Status: AC | PRN

## 2019-01-14 MED ORDER — CARBOPROST TROMETHAMINE 250 MCG/ML IM SOLN
INTRAMUSCULAR | Status: AC
  Filled 2019-01-14: qty 1

## 2019-01-14 MED ORDER — SODIUM CHLORIDE 0.9 % IV SOLN
INTRAVENOUS | Status: DC | PRN
  Administered 2019-01-14: 14:00:00 70 mL

## 2019-01-14 MED ORDER — SODIUM CHLORIDE (PF) 0.9 % IJ SOLN
INTRAMUSCULAR | Status: AC
  Filled 2019-01-14: qty 50

## 2019-01-14 MED ORDER — MENTHOL 3 MG MT LOZG
1.0000 | LOZENGE | OROMUCOSAL | Status: DC | PRN
  Filled 2019-01-14: qty 9

## 2019-01-14 MED ORDER — OXYTOCIN 40 UNITS IN NORMAL SALINE INFUSION - SIMPLE MED
2.5000 [IU]/h | INTRAVENOUS | Status: AC
  Administered 2019-01-14: 2.5 [IU]/h via INTRAVENOUS
  Filled 2019-01-14: qty 1000

## 2019-01-14 MED ORDER — MORPHINE SULFATE (PF) 0.5 MG/ML IJ SOLN
INTRAMUSCULAR | Status: DC | PRN
  Administered 2019-01-14: .1 mg via EPIDURAL

## 2019-01-14 MED ORDER — GABAPENTIN 300 MG PO CAPS
300.0000 mg | ORAL_CAPSULE | Freq: Every day | ORAL | Status: DC
  Filled 2019-01-14: qty 1

## 2019-01-14 MED ORDER — SIMETHICONE 80 MG PO CHEW
80.0000 mg | CHEWABLE_TABLET | Freq: Three times a day (TID) | ORAL | Status: DC
  Administered 2019-01-14 – 2019-01-16 (×5): 80 mg via ORAL
  Filled 2019-01-14 (×5): qty 1

## 2019-01-14 MED ORDER — SERTRALINE HCL 25 MG PO TABS
175.0000 mg | ORAL_TABLET | Freq: Every day | ORAL | Status: DC
  Administered 2019-01-14 – 2019-01-16 (×3): 175 mg via ORAL
  Filled 2019-01-14: qty 1
  Filled 2019-01-14: qty 3
  Filled 2019-01-14: qty 1

## 2019-01-14 MED ORDER — BISACODYL 10 MG RE SUPP
10.0000 mg | Freq: Every day | RECTAL | Status: DC | PRN
  Filled 2019-01-14: qty 1

## 2019-01-14 MED ORDER — KETOROLAC TROMETHAMINE 30 MG/ML IJ SOLN
30.0000 mg | Freq: Four times a day (QID) | INTRAMUSCULAR | Status: AC
  Administered 2019-01-14 – 2019-01-15 (×3): 30 mg via INTRAVENOUS
  Filled 2019-01-14 (×4): qty 1

## 2019-01-14 SURGICAL SUPPLY — 29 items
CANISTER SUCT 3000ML PPV (MISCELLANEOUS) ×3 IMPLANT
CHLORAPREP W/TINT 26 (MISCELLANEOUS) ×3 IMPLANT
COVER WAND RF STERILE (DRAPES) ×3 IMPLANT
DRSG TELFA 3X8 NADH (GAUZE/BANDAGES/DRESSINGS) ×3 IMPLANT
ELECT REM PT RETURN 9FT ADLT (ELECTROSURGICAL) ×3
ELECTRODE REM PT RTRN 9FT ADLT (ELECTROSURGICAL) ×1 IMPLANT
GAUZE SPONGE 4X4 12PLY STRL (GAUZE/BANDAGES/DRESSINGS) ×3 IMPLANT
GOWN STRL REUS W/ TWL LRG LVL3 (GOWN DISPOSABLE) ×3 IMPLANT
GOWN STRL REUS W/TWL LRG LVL3 (GOWN DISPOSABLE) ×6
KIT PREVENA INCISION MGT20CM45 (CANNISTER) ×2 IMPLANT
NDL HYPO 25GX1X1/2 BEV (NEEDLE) ×1 IMPLANT
NEEDLE HYPO 25GX1X1/2 BEV (NEEDLE) ×3 IMPLANT
NS IRRIG 1000ML POUR BTL (IV SOLUTION) ×3 IMPLANT
PACK C SECTION AR (MISCELLANEOUS) ×3 IMPLANT
PAD DRESSING TELFA 3X8 NADH (GAUZE/BANDAGES/DRESSINGS) ×1 IMPLANT
PAD OB MATERNITY 4.3X12.25 (PERSONAL CARE ITEMS) ×3 IMPLANT
PAD PREP 24X41 OB/GYN DISP (PERSONAL CARE ITEMS) ×3 IMPLANT
PENCIL SMOKE ULTRAEVAC 22 CON (MISCELLANEOUS) ×3 IMPLANT
STAPLER INSORB 30 2030 C-SECTI (MISCELLANEOUS) ×2 IMPLANT
SUT MNCRL 4-0 (SUTURE) ×2
SUT MNCRL 4-0 27XMFL (SUTURE) ×1
SUT PDS AB 1 TP1 96 (SUTURE) ×2 IMPLANT
SUT VIC AB 0 CT1 36 (SUTURE) ×14 IMPLANT
SUT VIC AB 0 CTX 36 (SUTURE) ×4
SUT VIC AB 0 CTX36XBRD ANBCTRL (SUTURE) ×2 IMPLANT
SUT VIC AB 2-0 SH 27 (SUTURE) ×6
SUT VIC AB 2-0 SH 27XBRD (SUTURE) ×2 IMPLANT
SUTURE MNCRL 4-0 27XMF (SUTURE) ×1 IMPLANT
SYR 30ML LL (SYRINGE) ×6 IMPLANT

## 2019-01-14 NOTE — Progress Notes (Signed)
   01/14/19 1500  Clinical Encounter Type  Visited With Patient and family together  Visit Type Initial;Spiritual support  Referral From Nurse  Consult/Referral To Chaplain  Spiritual Encounters  Spiritual Needs Prayer  Chaplain received OR for prayer request. Patient had emergency C-section so Chaplain was not able to visit before then. However, patient was appreciative for visit and Chaplain gave blessing to patient and husband who was at bedside.

## 2019-01-14 NOTE — Op Note (Signed)
Cesarean Section Procedure Note  Indications: prior c/s  Pre-operative Diagnosis:  1. Intrauterine pregnancy at [redacted]w[redacted]d;  2. Desires permanent sterilization;  3. Hx of significant adhesions  Post-operative Diagnosis: same, delivered.  Procedure: 1. Repeat Low Transverse Cesarean Section through Pfannenstiel incision  2. Bilateral tubal sterilization using modified Parkland method  Surgeon: Christeen Douglas, MD  Assistant(s):  Jennell Corner, MD  Anesthesia: Spinal anesthesia  Estimated Blood Loss:  800         Drains: foley and wound vac, ppx         Total IV Fluids: see anesthesia  Urine Output: see anesthesia         Specimens: placenta         Complications:  None; patient tolerated the procedure well.         Disposition: PACU - hemodynamically stable.         Condition: stable  Findings:  A female infant in cephlic presentation. Amniotic fluid - Clear  Birth weight 3920 g.  Apgars of 8 and 9.   Intact placenta with a three-vessel cord.  Grossly normal uterus, tubes and ovaries bilaterally. Minimal intraabdominal adhesions were noted. I did use interceed at last c/s and improved adhesions present  Procedure Details  The patient was taken to Operating Room, identified as the correct patient and the procedure verified as C-Section Delivery. A Time Out was held and the above information confirmed.  After induction of anesthesia, the patient was draped and prepped in the usual sterile manner. A Pfannenstiel incision was made and carried down through the subcutaneous tissue to the fascia. Fascial incision was made and extended transversely with the Mayo scissors. The fascia was separated from the underlying rectus tissue superiorly and inferiorly. The peritoneum was identified and entered bluntly. Peritoneal incision was extended longitudinally. The utero-vesical peritoneal reflection was incised transversely and a bladder flap was created digitally.   A low transverse  hysterotomy was made. The fetus was delivered atraumatically. The umbilical cord was clamped x2 and cut and the infant was handed to the awaiting pediatricians. The placenta was removed intact and appeared normal with a 3-vessel cord.   The uterus was exteriorized and cleared of all clot and debris. The hysterotomy was closed with running sutures of  0-Vicryl. A second imbricating layer was placed with the same suture. Excellent hemostasis was observed.   Attention was then turned to the tubal ligation. The left fallopian tube distinguished from the round ligament by identifying the fimbria and was grasped with a Babcock clamp in the midisthmic portion approximately 3 cm from the cornual region. It was then doubly ligated with 0-plain gut suture in a Parkland fashion. The tubal segment was excised with Metzenbaum scissors. Tubal ostea noted. The procedure was repeated on the right side. *Care was noted to examine both tubal sites in situ to ensure the sutures were intact and no bleeding was noted. The uterus was returned to the abdomen.  The pelvis was irrigated and again, excellent hemostasis was noted. The fascia was then reapproximated with running sutures of 0 Vicryl.  The subcutaneous tissue was reapproximated with running sutures of vicryl. The skin was reapproximated with Insorb absorbable staples. A provena wound vac was placed ppx. Exparel was used in standard fashion at the fascial and skin layers.  Instrument, sponge, and needle counts were correct prior to the abdominal closure and at the conclusion of the case.   The patient tolerated the procedure well and was transferred to the recovery room  in stable condition.   Benjaman Kindler, MD12/17/2020
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
## 2019-01-14 NOTE — Anesthesia Procedure Notes (Addendum)
Spinal  Patient location during procedure: OR Start time: 01/14/2019 12:30 PM End time: 01/14/2019 12:39 PM Staffing Performed: resident/CRNA  Anesthesiologist: Molli Barrows, MD Resident/CRNA: Johnna Acosta, CRNA Preanesthetic Checklist Completed: patient identified, IV checked, site marked, risks and benefits discussed, surgical consent, monitors and equipment checked, pre-op evaluation and timeout performed Spinal Block Patient position: sitting Prep: ChloraPrep Patient monitoring: heart rate, continuous pulse ox, blood pressure and cardiac monitor Approach: midline Location: L3-4 Injection technique: single-shot Needle Needle type: Whitacre and Introducer  Needle gauge: 24 G Needle length: 9 cm Assessment Sensory level: T10 Additional Notes Negative paresthesia. Negative blood return. Positive free-flowing CSF. Expiration date of kit checked and confirmed. Patient tolerated procedure well, without complications.

## 2019-01-14 NOTE — Anesthesia Post-op Follow-up Note (Signed)
Anesthesia QCDR form completed.        

## 2019-01-14 NOTE — Anesthesia Procedure Notes (Signed)
Spinal

## 2019-01-14 NOTE — Plan of Care (Signed)
Pt G3P2 delivered at 1259 via repeat c/s. Pt recovered well in PACU and pain managed well. Patient to mother baby for couplet care.

## 2019-01-14 NOTE — Transfer of Care (Signed)
Immediate Anesthesia Transfer of Care Note  Patient: Melissa Carr  Procedure(s) Performed: REPEAT CESAREAN SECTION WITH BILATERAL TUBAL LIGATION (Bilateral )  Patient Location: PACU  Anesthesia Type:Spinal  Level of Consciousness: awake, alert  and oriented  Airway & Oxygen Therapy: Patient Spontanous Breathing  Post-op Assessment: Report given to RN and Post -op Vital signs reviewed and stable  Post vital signs: Reviewed and stable  Last Vitals:  Vitals Value Taken Time  BP 114/68 01/14/19 1416  Temp    Pulse 89 01/14/19 1416  Resp 25 01/14/19 1416  SpO2 100 % 01/14/19 1416    Last Pain:  Vitals:   01/14/19 1115  TempSrc:   PainSc: 6          Complications: No apparent anesthesia complications

## 2019-01-14 NOTE — Anesthesia Procedure Notes (Signed)
Date/Time: 01/14/2019 12:40 PM Performed by: Johnna Acosta, CRNA Pre-anesthesia Checklist: Patient identified, Emergency Drugs available, Suction available, Patient being monitored and Timeout performed Patient Re-evaluated:Patient Re-evaluated prior to induction Oxygen Delivery Method: Nasal cannula Preoxygenation: Pre-oxygenation with 100% oxygen
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
## 2019-01-14 NOTE — Discharge Summary (Signed)
Obstetrical Discharge Summary  Patient Name: Melissa Carr DOB: 1985/12/06 MRN: [id number]  Date of Admission: 01/14/2019 Date of Discharge: 01/16/2019  Primary OB: [facility]  Gestational Age at Delivery: [redacted]w[redacted]d   Antepartum complications:   -Hx of C/S x2 with hx of wound infection and mesh placement 2 surgeries ago;Repeat with BTL scheduled 01/15/2019  - Last c/s she had significant adhesive disease, limiting the surgery. She is aware of the risk that she may not be able to get a BTL, in which case we would choose IUD.  - We will plan for postop wound vacuum  -GDMA1; 28w 1h OGTT: 165, 3h GTT positive for DM; [phone]             - diet controlled with normal blood sugars, but S>D -normal ultrasounds  -Hx of depression; on 100 mg Zoloft at beginning of pregnancy, now on 175mg  daily  EPDS score 18.  Test was administered by spouse.  The nurse was in  the room and reports that the spouse did not give the patient all the  answers to choose from.  Social work was consulted d/t the high score,  please see social work note.  Patient denies SI/HI.  States that she feels  safe discharging home.  Will continue Zoloft.  Resources given.    -Prepregnancy BMI 42.53  Admitting Diagnosis: labor Secondary Diagnosis:prior c/s Patient Active Problem List   Diagnosis Date Noted  . Pregnancy, supervision, high-risk, third trimester 01/14/2019  . 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 Encounter for antenatal screening for chromosomal anomalies   . Indication for care in labor or delivery 10/10/2016  . Labor and delivery indication for care or intervention 09/27/2016  . Labor and delivery, indication for care 09/24/2016    Complications: None Intrapartum complications/course:   Repeat LTCS with BTL   Date of Delivery: 01/14/2019 Delivered By: 01/16/2019, MD Delivery Type: repeat cesarean section, low transverse incision Anesthesia:  spinal Placenta: Extracted Laceration: n/a Episiotomy: none Newborn Data: Live born female  Birth Weight: 8 lb 10.3 oz (3920 g) APGAR: 8, 9  Newborn Delivery   Birth date/time: 01/14/2019 12:59:00 Delivery type: C-Section, Low Transverse Trial of labor: No C-section categorization: Repeat       Brief Hospital Course  (Cesarean Section): Melissa Carr is a Melissa Carr who underwent cesarean section on 01/14/2019.  Patient had an uncomplicated surgery; for further details of this surgery, please refer to the operative note.  Patient had an uncomplicated postpartum course.  By time of discharge on POD#2, her pain was controlled on oral pain medications; she had appropriate lochia and was ambulating, voiding without difficulty, tolerating regular diet and passing flatus.   She was deemed stable for discharge to home.     Discharge Physical Exam:  BP 119/73 (BP Location: Right Arm)   Pulse 89   Temp 97.7 F (36.5 C) (Oral)   Resp 18   Ht 5\' 4"  (1.626 m)   Wt 116.6 kg   LMP 04/16/2018   SpO2 100%   Breastfeeding Unknown   BMI 44.11 kg/m   General: NAD CV: RRR Pulm: CTABL, nl effort ABD: s/nd/nt, fundus firm and below the umbilicus Lochia: moderate Incision: c/d/i, wound vac in place  DVT Evaluation: LE non-ttp, no evidence of DVT on exam.  Hemoglobin  Date Value Ref Range Status  01/15/2019 11.3 (L) 12.0 - 15.0 g/dL Final   HCT  Date Value Ref Range Status  01/15/2019 33.5 (L) 36.0 -  46.0 % Final    Post partum course: Unremarkable Postpartum Procedures: None Disposition: stable, discharge to home. Baby Feeding: breastmilk Baby Disposition: home with mom  Rh Immune globulin given: Rh pos Rubella vaccine given: RI Tdap vaccine given in AP setting: 10/29/18 Flu vaccine given in AP setting: 10/29/18  Contraception: BTL  Prenatal Labs: Blood type/Rh --/--/B POS (12/15 1235)  Antibody screen neg  Rubella Immune  Varicella Immune  RPR NR  HBsAg Neg  HIV NR  GC  neg  Chlamydia neg  Genetic screening negative  1 hour GTT 165  3 hour GTT [id number]  GBS neg     Plan:  Melissa Carr was discharged to home in good condition. Follow-up appointment at [place] with delivering provider in 1 week for wound vac check and in 6 weeks for postpartum care.   Discharge Medications: Allergies as of 01/16/2019      Reactions   Amitriptyline Other (See Comments)   Nightmares   Gabapentin Other (See Comments)   Difficult to wake up      Medication List    STOP taking these medications   aspirin 81 MG chewable tablet     TAKE these medications   acetaminophen 500 MG tablet Commonly known as: TYLENOL Take 2 tablets (1,000 mg total) by mouth every 6 (six) hours.   cyclobenzaprine 10 MG tablet Commonly known as: FLEXERIL Take 10 mg by mouth 3 (three) times daily as needed for muscle spasms.   fluticasone 50 MCG/ACT nasal spray Commonly known as: FLONASE Place 2 sprays into both nostrils daily as needed.   ibuprofen 800 MG tablet Commonly known as: ADVIL Take 1 tablet (800 mg total) by mouth every 6 (six) hours.   oxyCODONE 5 MG immediate release tablet Commonly known as: Oxy IR/ROXICODONE Take 1 tablet (5 mg total) by mouth every 6 (six) hours as needed for up to 7 days for moderate pain.   prenatal multivitamin Tabs tablet Take 1 tablet by mouth daily at 12 noon.   sertraline 100 MG tablet Commonly known as: ZOLOFT Take 175 mg by mouth daily.   traZODone 50 MG tablet Commonly known as: DESYREL Take 25-50 mg by mouth at bedtime.       Follow-up Information    Benjaman Kindler, MD In 1 week.   Specialty: Obstetrics and Gynecology Why: For postop check and vac removal Contact information: [address] [place] [address] [phone]           Signed: Minda Meo, CNM

## 2019-01-15 LAB — CBC
HCT: 33.5 % — ABNORMAL LOW (ref 36.0–46.0)
Hemoglobin: 11.3 g/dL — ABNORMAL LOW (ref 12.0–15.0)
MCH: 29.4 pg (ref 26.0–34.0)
MCHC: 33.7 g/dL (ref 30.0–36.0)
MCV: 87 fL (ref 80.0–100.0)
Platelets: 146 10*3/uL — ABNORMAL LOW (ref 150–400)
RBC: 3.85 MIL/uL — ABNORMAL LOW (ref 3.87–5.11)
RDW: 13.8 % (ref 11.5–15.5)
WBC: 10.8 10*3/uL — ABNORMAL HIGH (ref 4.0–10.5)
nRBC: 0 % (ref 0.0–0.2)

## 2019-01-15 NOTE — Progress Notes (Signed)
Patient ID: Melissa Carr, female   DOB: 28-Nov-1985, 33 y.o.   MRN: [id number]  Post Partum Day 1 Subjective: Doing well, no complaints.  Tolerating regular diet, pain with PO meds, voiding and ambulating without difficulty.  No CP SOB Fever,Chills, N/V or leg pain; denies nipple or breast pain, no HA change of vision, RUQ/epigastric pain  Objective: BP (!) 103/53 (BP Location: Left Arm)   Pulse 93   Temp 97.7 F (36.5 C) (Oral)   Resp 18   Ht 5\' 4"  (1.626 m)   Wt 116.6 kg   LMP 04/16/2018   SpO2 98%   Breastfeeding Unknown   BMI 44.11 kg/m    Physical Exam:  General: NAD Breasts: soft/nontender CV: RRR Pulm: nl effort, CTABL Abdomen: soft, NT, BS x 4 Incision: wound vac in place, no erythema or drainage Lochia: small Uterine Fundus: fundus firm and 1 fb below umbilicus DVT Evaluation: no cords, ttp LEs   Recent Labs    01/12/19 1235 01/15/19 0723  HGB 11.9* 11.3*  HCT 33.3* 33.5*  WBC 8.7 10.8*  PLT 168 146*    Assessment/Plan: 33 y.o. [id number] postpartum day # 1  - Continue routine PP care - Lactation consult prn.  - BTL done - Immunization status:  all Imms up to date   Disposition: Does not desire Dc home today.     Francetta Found, CNM 01/15/2019  8:49 AM

## 2019-01-15 NOTE — Lactation Note (Signed)
This note was copied from a baby's chart. Lactation Consultation Note  Patient Name: Melissa Carr [id number] Date: 01/15/2019 Reason for consult: Follow-up assessment Baby rooting, latched easily to mom's left breast this feeding  Maternal Data    Feeding Feeding Type: Breast Fed  LATCH Score Latch: Grasps breast easily, tongue down, lips flanged, rhythmical sucking.  Audible Swallowing: A few with stimulation  Type of Nipple: Everted at rest and after stimulation  Comfort (Breast/Nipple): Soft / non-tender  Hold (Positioning): No assistance needed to correctly position infant at breast.  LATCH Score: 9  Interventions    Lactation Tools Discussed/Used     Consult Status Consult Status: Follow-up Date: 01/16/19 Follow-up type: In-patient    Ferol Luz 01/15/2019, 7:14 PM

## 2019-01-15 NOTE — Progress Notes (Signed)
CSW aware of consult for [place] 18. CSW called into MOB's room to address further needs. MOB asked that CSW call back around 10am. CSW understanding and to call back at that time.      Virgie Dad Nautika Cressey, MSW, LCSW [facility] [place] [facility] [place] [phone]

## 2019-01-15 NOTE — Lactation Note (Signed)
This note was copied from a baby's chart. Lactation Consultation Note  Patient Name: Melissa Carr [id number] Date: 01/15/2019 Reason for consult: Follow-up assessment   Maternal Data Formula Feeding for Exclusion: No Has patient been taught Hand Expression?: Yes Does the patient have breastfeeding experience prior to this delivery?: Yes  Feeding Feeding Type: Breast Fed Baby can latch with shaping of breast, has trouble coordinating suck and fussy at breast, will latch and nurse for few minutes then loose latch, various positions tried with support pillows, baby gassy, fussy after passing gas, then skin to skin to  Calm, once calmed placed back at breast and would not latch LATCH Score Latch: Repeated attempts needed to sustain latch, nipple held in mouth throughout feeding, stimulation needed to elicit sucking reflex.  Audible Swallowing: None  Type of Nipple: Everted at rest and after stimulation  Comfort (Breast/Nipple): Soft / non-tender  Hold (Positioning): Assistance needed to correctly position infant at breast and maintain latch.  LATCH Score: 6  Interventions Interventions: Assisted with latch;Skin to skin;Hand express;Breast compression;Adjust position;Support pillows  Lactation Tools Discussed/Used WIC Program: No Attempt again at next feeding when showing cues  Consult Status Consult Status: Follow-up Date: 01/16/19 Follow-up type: In-patient    Ferol Luz 01/15/2019, 5:24 PM

## 2019-01-15 NOTE — Anesthesia Post-op Follow-up Note (Signed)
  Anesthesia Pain Follow-up Note  Patient: Melissa Carr  Day #: 1  Date of Follow-up: 01/15/2019 Time: 7:17 AM  Last Vitals:  Vitals:   01/15/19 0417 01/15/19 0500  BP: 110/67   Pulse: 81   Resp: 18   Temp: 36.5 C   SpO2: 97% 94%    Level of Consciousness: alert  Pain: none   Side Effects:None  Catheter Site Exam:clean, dry, no drainage     Plan: D/C from anesthesia care at surgeon's request  Hedda Slade

## 2019-01-15 NOTE — Anesthesia Postprocedure Evaluation (Signed)
Anesthesia Post Note  Patient: Melissa Carr  Procedure(s) Performed: REPEAT CESAREAN SECTION WITH BILATERAL TUBAL LIGATION (Bilateral )  Patient location during evaluation: Mother Baby Anesthesia Type: Spinal Level of consciousness: oriented and awake and alert Pain management: pain level controlled Vital Signs Assessment: post-procedure vital signs reviewed and stable Respiratory status: spontaneous breathing and respiratory function stable Cardiovascular status: blood pressure returned to baseline and stable Postop Assessment: no headache, no backache, no apparent nausea or vomiting and able to ambulate Anesthetic complications: no     Last Vitals:  Vitals:   01/15/19 0417 01/15/19 0500  BP: 110/67   Pulse: 81   Resp: 18   Temp: 36.5 C   SpO2: 97% 94%    Last Pain:  Vitals:   01/15/19 0417  TempSrc: Oral  PainSc:                  Hedda Slade
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
## 2019-01-16 MED ORDER — OXYCODONE HCL 5 MG PO TABS: 5.0000 mg | ORAL_TABLET | Freq: Four times a day (QID) | ORAL | 0 refills | Status: AC | PRN

## 2019-01-16 MED ORDER — ACETAMINOPHEN 500 MG PO TABS: 1000.0000 mg | ORAL_TABLET | Freq: Four times a day (QID) | ORAL | 0 refills | Status: AC

## 2019-01-16 MED ORDER — IBUPROFEN 800 MG PO TABS: 800.0000 mg | ORAL_TABLET | Freq: Four times a day (QID) | ORAL | 0 refills | Status: AC

## 2019-01-16 NOTE — Plan of Care (Signed)
Reviewed D/C instructions with pt and family. Pt verbalized understanding of teaching. Discharged to home via W/C. Pt to schedule f/u appt.  

## 2019-01-16 NOTE — Discharge Instructions (Signed)
Cesarean Delivery, Care After This sheet gives you information about how to care for yourself after your procedure. Your health care provider may also give you more specific instructions. If you have problems or questions, contact your health care provider. What can I expect after the procedure? After the procedure, it is common to have:  A small amount of blood or clear fluid coming from the incision.  Some redness, swelling, and pain in your incision area.  Some abdominal pain and soreness.  Vaginal bleeding (lochia). Even though you did not have a vaginal delivery, you will still have vaginal bleeding and discharge.  Pelvic cramps.  Fatigue. You may have pain, swelling, and discomfort in the tissue between your vagina and your anus (perineum) if:  Your C-section was unplanned, and you were allowed to labor and push.  An incision was made in the area (episiotomy) or the tissue tore during attempted vaginal delivery. Follow these instructions at home: Incision care   Follow instructions from your health care provider about how to take care of your incision. Make sure you: ? Wash your hands with soap and water before you change your bandage (dressing). If soap and water are not available, use hand sanitizer. ? If you have a dressing, change it or remove it as told by your health care provider. ? Leave stitches (sutures), skin staples, skin glue, or adhesive strips in place. These skin closures may need to stay in place for 2 weeks or longer. If adhesive strip edges start to loosen and curl up, you may trim the loose edges. Do not remove adhesive strips completely unless your health care provider tells you to do that.  Check your incision area every day for signs of infection. Check for: ? More redness, swelling, or pain. ? More fluid or blood. ? Warmth. ? Pus or a bad smell.  Do not take baths, swim, or use a hot tub until your health care provider says it's okay. Ask your health  care provider if you can take showers.  When you cough or sneeze, hug a pillow. This helps with pain and decreases the chance of your incision opening up (dehiscing). Do this until your incision heals. Medicines  Take over-the-counter and prescription medicines only as told by your health care provider.  If you were prescribed an antibiotic medicine, take it as told by your health care provider. Do not stop taking the antibiotic even if you start to feel better.  Do not drive or use heavy machinery while taking prescription pain medicine. Lifestyle  Do not drink alcohol. This is especially important if you are breastfeeding or taking pain medicine.  Do not use any products that contain nicotine or tobacco, such as cigarettes, e-cigarettes, and chewing tobacco. If you need help quitting, ask your health care provider. Eating and drinking  Drink at least 8 eight-ounce glasses of water every day unless told not to by your health care provider. If you breastfeed, you may need to drink even more water.  Eat high-fiber foods every day. These foods may help prevent or relieve constipation. High-fiber foods include: ? Whole grain cereals and breads. ? Brown rice. ? Beans. ? Fresh fruits and vegetables. Activity   If possible, have someone help you care for your baby and help with household activities for at least a few days after you leave the hospital.  Return to your normal activities as told by your health care provider. Ask your health care provider what activities are safe for   you.  Rest as much as possible. Try to rest or take a nap while your baby is sleeping.  Do not lift anything that is heavier than 10 lbs (4.5 kg), or the limit that you were told, until your health care provider says that it is safe.  Talk with your health care provider about when you can engage in sexual activity. This may depend on your: ? Risk of infection. ? How fast you heal. ? Comfort and desire to  engage in sexual activity. General instructions  Do not use tampons or douches until your health care provider approves.  Wear loose, comfortable clothing and a supportive and well-fitting bra.  Keep your perineum clean and dry. Wipe from front to back when you use the toilet.  If you pass a blood clot, save it and call your health care provider to discuss. Do not flush blood clots down the toilet before you get instructions from your health care provider.  Keep all follow-up visits for you and your baby as told by your health care provider. This is important. Contact a health care provider if:  You have: ? A fever. ? Bad-smelling vaginal discharge. ? Pus or a bad smell coming from your incision. ? Difficulty or pain when urinating. ? A sudden increase or decrease in the frequency of your bowel movements. ? More redness, swelling, or pain around your incision. ? More fluid or blood coming from your incision. ? A rash. ? Nausea. ? Little or no interest in activities you used to enjoy. ? Questions about caring for yourself or your baby.  Your incision feels warm to the touch.  Your breasts turn red or become painful or hard.  You feel unusually sad or worried.  You vomit.  You pass a blood clot from your vagina.  You urinate more than usual.  You are dizzy or light-headed. Get help right away if:  You have: ? Pain that does not go away or get better with medicine. ? Chest pain. ? Difficulty breathing. ? Blurred vision or spots in your vision. ? Thoughts about hurting yourself or your baby. ? New pain in your abdomen or in one of your legs. ? A severe headache.  You faint.  You bleed from your vagina so much that you fill more than one sanitary pad in one hour. Bleeding should not be heavier than your heaviest period. Summary  After the procedure, it is common to have pain at your incision site, abdominal cramping, and slight bleeding from your vagina.  Check  your incision area every day for signs of infection.  Tell your health care provider about any unusual symptoms.  Keep all follow-up visits for you and your baby as told by your health care provider. This information is not intended to replace advice given to you by your health care provider. Make sure you discuss any questions you have with your health care provider. Document Released: 10/06/2001 Document Revised: 07/23/2017 Document Reviewed: 07/23/2017 Elsevier Patient Education  2020 [employer].   Breastfeeding  Choosing to breastfeed is one of the best decisions you can make for yourself and your baby. A change in hormones during pregnancy causes your breasts to make breast milk in your milk-producing glands. Hormones prevent breast milk from being released before your baby is born. They also prompt milk flow after birth. Once breastfeeding has begun, thoughts of your baby, as well as his or her sucking or crying, can stimulate the release of milk from your  milk-producing glands. Benefits of breastfeeding Research shows that breastfeeding offers many health benefits for infants and mothers. It also offers a cost-free and convenient way to feed your baby. For your baby  Your first milk (colostrum) helps your baby's digestive system to function better.  Special cells in your milk (antibodies) help your baby to fight off infections.  Breastfed babies are less likely to develop asthma, allergies, obesity, or type 2 diabetes. They are also at lower risk for sudden infant death syndrome (SIDS).  Nutrients in breast milk are better able to meet your baby's needs compared to infant formula.  Breast milk improves your baby's brain development. For you  Breastfeeding helps to create a very special bond between you and your baby.  Breastfeeding is convenient. Breast milk costs nothing and is always available at the correct temperature.  Breastfeeding helps to burn calories. It helps you  to lose the weight that you gained during pregnancy.  Breastfeeding makes your uterus return faster to its size before pregnancy. It also slows bleeding (lochia) after you give birth.  Breastfeeding helps to lower your risk of developing type 2 diabetes, osteoporosis, rheumatoid arthritis, cardiovascular disease, and breast, ovarian, uterine, and endometrial cancer later in life. Breastfeeding basics Starting breastfeeding  Find a comfortable place to sit or lie down, with your neck and back well-supported.  Place a pillow or a rolled-up blanket under your baby to bring him or her to the level of your breast (if you are seated). Nursing pillows are specially designed to help support your arms and your baby while you breastfeed.  Make sure that your baby's tummy (abdomen) is facing your abdomen.  Gently massage your breast. With your fingertips, massage from the outer edges of your breast inward toward the nipple. This encourages milk flow. If your milk flows slowly, you may need to continue this action during the feeding.  Support your breast with 4 fingers underneath and your thumb above your nipple (make the letter "C" with your hand). Make sure your fingers are well away from your nipple and your baby's mouth.  Stroke your baby's lips gently with your finger or nipple.  When your baby's mouth is open wide enough, quickly bring your baby to your breast, placing your entire nipple and as much of the areola as possible into your baby's mouth. The areola is the colored area around your nipple. ? More areola should be visible above your baby's upper lip than below the lower lip. ? Your baby's lips should be opened and extended outward (flanged) to ensure an adequate, comfortable latch. ? Your baby's tongue should be between his or her lower gum and your breast.  Make sure that your baby's mouth is correctly positioned around your nipple (latched). Your baby's lips should create a seal on your  breast and be turned out (everted).  It is common for your baby to suck about 2-3 minutes in order to start the flow of breast milk. Latching Teaching your baby how to latch onto your breast properly is very important. An improper latch can cause nipple pain, decreased milk supply, and poor weight gain in your baby. Also, if your baby is not latched onto your nipple properly, he or she may swallow some air during feeding. This can make your baby fussy. Burping your baby when you switch breasts during the feeding can help to get rid of the air. However, teaching your baby to latch on properly is still the best way to prevent fussiness  from swallowing air while breastfeeding. Signs that your baby has successfully latched onto your nipple  Silent tugging or silent sucking, without causing you pain. Infant's lips should be extended outward (flanged).  Swallowing heard between every 3-4 sucks once your milk has started to flow (after your let-down milk reflex occurs).  Muscle movement above and in front of his or her ears while sucking. Signs that your baby has not successfully latched onto your nipple  Sucking sounds or smacking sounds from your baby while breastfeeding.  Nipple pain. If you think your baby has not latched on correctly, slip your finger into the corner of your baby's mouth to break the suction and place it between your baby's gums. Attempt to start breastfeeding again. Signs of successful breastfeeding Signs from your baby  Your baby will gradually decrease the number of sucks or will completely stop sucking.  Your baby will fall asleep.  Your baby's body will relax.  Your baby will retain a small amount of milk in his or her mouth.  Your baby will let go of your breast by himself or herself. Signs from you  Breasts that have increased in firmness, weight, and size 1-3 hours after feeding.  Breasts that are softer immediately after breastfeeding.  Increased milk  volume, as well as a change in milk consistency and color by the fifth day of breastfeeding.  Nipples that are not sore, cracked, or bleeding. Signs that your baby is getting enough milk  Wetting at least 1-2 diapers during the first 24 hours after birth.  Wetting at least 5-6 diapers every 24 hours for the first week after birth. The urine should be clear or pale yellow by the age of 5 days.  Wetting 6-8 diapers every 24 hours as your baby continues to grow and develop.  At least 3 stools in a 24-hour period by the age of 5 days. The stool should be soft and yellow.  At least 3 stools in a 24-hour period by the age of 7 days. The stool should be seedy and yellow.  No loss of weight greater than 10% of birth weight during the first 3 days of life.  Average weight gain of 4-7 oz (113-198 g) per week after the age of 4 days.  Consistent daily weight gain by the age of 5 days, without weight loss after the age of 2 weeks. After a feeding, your baby may spit up a small amount of milk. This is normal. Breastfeeding frequency and duration Frequent feeding will help you make more milk and can prevent sore nipples and extremely full breasts (breast engorgement). Breastfeed when you feel the need to reduce the fullness of your breasts or when your baby shows signs of hunger. This is called "breastfeeding on demand." Signs that your baby is hungry include:  Increased alertness, activity, or restlessness.  Movement of the head from side to side.  Opening of the mouth when the corner of the mouth or cheek is stroked (rooting).  Increased sucking sounds, smacking lips, cooing, sighing, or squeaking.  Hand-to-mouth movements and sucking on fingers or hands.  Fussing or crying. Avoid introducing a pacifier to your baby in the first 4-6 weeks after your baby is born. After this time, you may choose to use a pacifier. Research has shown that pacifier use during the first year of a baby's life  decreases the risk of sudden infant death syndrome (SIDS). Allow your baby to feed on each breast as long as he or  she wants. When your baby unlatches or falls asleep while feeding from the first breast, offer the second breast. Because newborns are often sleepy in the first few weeks of life, you may need to awaken your baby to get him or her to feed. Breastfeeding times will vary from baby to baby. However, the following rules can serve as a guide to help you make sure that your baby is properly fed:  Newborns (babies 47 weeks of age or younger) may breastfeed every 1-3 hours.  Newborns should not go without breastfeeding for longer than 3 hours during the day or 5 hours during the night.  You should breastfeed your baby a minimum of 8 times in a 24-hour period. Breast milk pumping     Pumping and storing breast milk allows you to make sure that your baby is exclusively fed your breast milk, even at times when you are unable to breastfeed. This is especially important if you go back to work while you are still breastfeeding, or if you are not able to be present during feedings. Your lactation consultant can help you find a method of pumping that works best for you and give you guidelines about how long it is safe to store breast milk. Caring for your breasts while you breastfeed Nipples can become dry, cracked, and sore while breastfeeding. The following recommendations can help keep your breasts moisturized and healthy:  Avoid using soap on your nipples.  Wear a supportive bra designed especially for nursing. Avoid wearing underwire-style bras or extremely tight bras (sports bras).  Air-dry your nipples for 3-4 minutes after each feeding.  Use only cotton bra pads to absorb leaked breast milk. Leaking of breast milk between feedings is normal.  Use lanolin on your nipples after breastfeeding. Lanolin helps to maintain your skin's normal moisture barrier. Pure lanolin is not harmful (not  toxic) to your baby. You may also hand express a few drops of breast milk and gently massage that milk into your nipples and allow the milk to air-dry. In the first few weeks after giving birth, some women experience breast engorgement. Engorgement can make your breasts feel heavy, warm, and tender to the touch. Engorgement peaks within 3-5 days after you give birth. The following recommendations can help to ease engorgement:  Completely empty your breasts while breastfeeding or pumping. You may want to start by applying warm, moist heat (in the shower or with warm, water-soaked hand towels) just before feeding or pumping. This increases circulation and helps the milk flow. If your baby does not completely empty your breasts while breastfeeding, pump any extra milk after he or she is finished.  Apply ice packs to your breasts immediately after breastfeeding or pumping, unless this is too uncomfortable for you. To do this: ? Put ice in a plastic bag. ? Place a towel between your skin and the bag. ? Leave the ice on for 20 minutes, 2-3 times a day.  Make sure that your baby is latched on and positioned properly while breastfeeding. If engorgement persists after 48 hours of following these recommendations, contact your health care provider or a Advertising copywriter. Overall health care recommendations while breastfeeding  Eat 3 healthy meals and 3 snacks every day. Well-nourished mothers who are breastfeeding need an additional 450-500 calories a day. You can meet this requirement by increasing the amount of a balanced diet that you eat.  Drink enough water to keep your urine pale yellow or clear.  Rest often, relax, and continue  to take your prenatal vitamins to prevent fatigue, stress, and low vitamin and mineral levels in your body (nutrient deficiencies).  Do not use any products that contain nicotine or tobacco, such as cigarettes and e-cigarettes. Your baby may be harmed by chemicals from  cigarettes that pass into breast milk and exposure to secondhand smoke. If you need help quitting, ask your health care provider.  Avoid alcohol.  Do not use illegal drugs or marijuana.  Talk with your health care provider before taking any medicines. These include over-the-counter and prescription medicines as well as vitamins and herbal supplements. Some medicines that may be harmful to your baby can pass through breast milk.  It is possible to become pregnant while breastfeeding. If birth control is desired, ask your health care provider about options that will be safe while breastfeeding your baby. Where to find more information: [employer]: www.llli.org Contact a health care provider if:  You feel like you want to stop breastfeeding or have become frustrated with breastfeeding.  Your nipples are cracked or bleeding.  Your breasts are red, tender, or warm.  You have: ? Painful breasts or nipples. ? A swollen area on either breast. ? A fever or chills. ? Nausea or vomiting. ? Drainage other than breast milk from your nipples.  Your breasts do not become full before feedings by the fifth day after you give birth.  You feel sad and depressed.  Your baby is: ? Too sleepy to eat well. ? Having trouble sleeping. ? More than 741 week old and wetting fewer than 6 diapers in a 24-hour period. ? Not gaining weight by 215 days of age.  Your baby has fewer than 3 stools in a 24-hour period.  Your baby's skin or the white parts of his or her eyes become yellow. Get help right away if:  Your baby is overly tired (lethargic) and does not want to wake up and feed.  Your baby develops an unexplained fever. Summary  Breastfeeding offers many health benefits for infant and mothers.  Try to breastfeed your infant when he or she shows early signs of hunger.  Gently tickle or stroke your baby's lips with your finger or nipple to allow the baby to open his or her mouth.  Bring the baby to your breast. Make sure that much of the areola is in your baby's mouth. Offer one side and burp the baby before you offer the other side.  Talk with your health care provider or lactation consultant if you have questions or you face problems as you breastfeed. This information is not intended to replace advice given to you by your health care provider. Make sure you discuss any questions you have with your health care provider. Document Released: 01/14/2005 Document Revised: 04/10/2017 Document Reviewed: 02/16/2016 Elsevier Patient Education  2020 [employer]

## 2019-01-18 LAB — SURGICAL PATHOLOGY

## 2022-12-04 ENCOUNTER — Other Ambulatory Visit: Payer: Self-pay

## 2022-12-04 MED ORDER — ZEPBOUND 5 MG/0.5ML ~~LOC~~ SOLN: 5.0000 mg | SUBCUTANEOUS | 0 refills | Status: AC
